# Patient Record
Sex: Female | Born: 1995
Health system: Southern US, Community
[De-identification: ages and names within clinical notes are randomized; demographics above are authoritative.]

## PROBLEM LIST (undated history)

## (undated) DIAGNOSIS — F429 Obsessive-compulsive disorder, unspecified: Secondary | ICD-10-CM

## (undated) DIAGNOSIS — F320 Major depressive disorder, single episode, mild: Secondary | ICD-10-CM

---
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
## 2016-01-14 ENCOUNTER — Ambulatory Visit (INDEPENDENT_AMBULATORY_CARE_PROVIDER_SITE_OTHER): Payer: BLUE CROSS/BLUE SHIELD | Admitting: Family Medicine

## 2016-01-14 ENCOUNTER — Encounter: Payer: Self-pay | Admitting: Family Medicine

## 2016-01-14 VITALS — BP 108/54 | HR 85 | Ht 64.0 in | Wt 115.0 lb

## 2016-01-14 DIAGNOSIS — F429 Obsessive-compulsive disorder, unspecified: Secondary | ICD-10-CM | POA: Diagnosis not present

## 2016-01-14 DIAGNOSIS — Z0000 Encounter for general adult medical examination without abnormal findings: Secondary | ICD-10-CM

## 2016-01-14 NOTE — Progress Notes (Signed)
   Subjective:    Patient ID: Katherine Michael, female    DOB: 12/13/1995, 20 y.o.   MRN: [id number]  HPI 20 year old female comes in today to establish care.She would like a physical. 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 She has some college and is currently in school to become an Charity fundraiserN. She is dating and is currently sexually active. She currently lives with her mother father and brother. She reports that she is very healthy. She was diagnosed with OCD when she was [age] and has been on Zoloft ever since then. Her doses have varied over the years and on how much stress or anxiety she has been experiencing. She does not actively see a psychiatrist or therapist at this point in time. Though she has in the past. Her flu shot is up-to-date.   Review of Systems  Constitutional: Negative for diaphoresis, fever and unexpected weight change.  HENT: Negative for hearing loss, rhinorrhea and tinnitus.   Eyes: Negative for visual disturbance.  Respiratory: Negative for cough and wheezing.   Cardiovascular: Negative for chest pain and palpitations.  Gastrointestinal: Negative for blood in stool, diarrhea, nausea and vomiting.  Genitourinary: Negative for difficulty urinating, vaginal bleeding and vaginal discharge.  Musculoskeletal: Negative for arthralgias and myalgias.  Skin: Negative for rash.  Neurological: Negative for headaches.  Hematological: Negative for adenopathy. Does not bruise/bleed easily.  Psychiatric/Behavioral: Negative for dysphoric mood and sleep disturbance. The patient is nervous/anxious.        Objective:   Physical Exam  Constitutional: She is oriented to person, place, and time. She appears well-developed and well-nourished.  HENT:  Head: Normocephalic and atraumatic.  Right Ear: External ear normal.  Left Ear: External ear normal.  Nose: Nose normal.  Mouth/Throat: Oropharynx is clear and moist.  TMs and canals are clear.    Eyes: Conjunctivae and EOM are normal. Pupils are equal, round, and reactive to light.  Neck: Neck supple. No thyromegaly present.  Cardiovascular: Normal rate, regular rhythm and normal heart sounds.   Pulmonary/Chest: Effort normal and breath sounds normal. She has no wheezes.  Abdominal: Soft. Bowel sounds are normal. She exhibits no distension and no mass. There is no tenderness. There is no rebound and no guarding.  Musculoskeletal: She exhibits no edema.  Lymphadenopathy:    She has no cervical adenopathy.  Neurological: She is alert and oriented to person, place, and time. She has normal reflexes.  Skin: Skin is warm and dry.  Psychiatric: She has a normal mood and affect. Her behavior is normal. Thought content normal.       Assessment & Plan:  CPE Keep up a regular exercise program and make sure you are eating a healthy diet Try to eat 4 servings of dairy a day, or if you are lactose intolerant take a calcium with vitamin D daily.  Your vaccines are up to date.   OCD-right now she is doing well currently on Zoloft 150 mg. Follow-up in 6 months. Okay to refill medication until then.

## 2016-01-14 NOTE — Patient Instructions (Signed)
Keep up a regular exercise program and make sure you are eating a healthy diet Try to eat 4 servings of dairy a day, or if you are lactose intolerant take a calcium with vitamin D daily.  Your vaccines are up to date.   

## 2016-05-28 ENCOUNTER — Other Ambulatory Visit: Payer: Self-pay | Admitting: *Deleted

## 2016-05-28 MED ORDER — SERTRALINE HCL 100 MG PO TABS: 150.0000 mg | ORAL_TABLET | Freq: Every day | ORAL | 0 refills | Status: DC

## 2016-07-10 ENCOUNTER — Ambulatory Visit (INDEPENDENT_AMBULATORY_CARE_PROVIDER_SITE_OTHER): Payer: BLUE CROSS/BLUE SHIELD | Admitting: Family Medicine

## 2016-07-10 ENCOUNTER — Encounter: Payer: Self-pay | Admitting: Family Medicine

## 2016-07-10 VITALS — BP 105/64 | HR 79 | Ht 64.0 in | Wt 114.0 lb

## 2016-07-10 DIAGNOSIS — F429 Obsessive-compulsive disorder, unspecified: Secondary | ICD-10-CM

## 2016-07-10 MED ORDER — SERTRALINE HCL 100 MG PO TABS: ORAL_TABLET | ORAL | 1 refills | Status: DC

## 2016-07-10 NOTE — Progress Notes (Signed)
   Subjective:    Patient ID: Katherine Michael, female    DOB: 01/06/1996, 21 y.o.   MRN: [id number]  HPI 21 year old female comes in today to follow-up for a CD. She is currently on sertraline. She is producing taking 150 mg about 2 months ago when ahead and went up to 200 mg. She feels like that and working well and has been effective and has not had any problems concerns or side effects. He is definitely had some increased stress levels because of school in fact her levels were quite high. She was having persistent diarrhea because of it was having to hydrate more. That's part of why she went up on her dosing but has been doing much better on 2 tabs. She denies feeling down or depressed.   Review of Systems     Objective:   Physical Exam  Constitutional: She is oriented to person, place, and time. She appears well-developed and well-nourished.  HENT:  Head: Normocephalic and atraumatic.  Cardiovascular: Normal rate, regular rhythm and normal heart sounds.   Pulmonary/Chest: Effort normal and breath sounds normal.  Neurological: She is alert and oriented to person, place, and time.  Skin: Skin is warm and dry.  Psychiatric: She has a normal mood and affect. Her behavior is normal.       Assessment & Plan:   OCD - Overall she is really doing well. She is at the max dose on sertraline. We'll keep her on this for about 6 months and she's doing well that time we may try to bump her back down to 150 mg. 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 PHQ 9 score of 2 and GAD 7 score of 13. Continue current regimen

## 2016-08-26 ENCOUNTER — Ambulatory Visit (INDEPENDENT_AMBULATORY_CARE_PROVIDER_SITE_OTHER): Payer: BLUE CROSS/BLUE SHIELD | Admitting: Family Medicine

## 2016-08-26 VITALS — BP 102/65 | HR 63

## 2016-08-26 DIAGNOSIS — Z111 Encounter for screening for respiratory tuberculosis: Secondary | ICD-10-CM | POA: Diagnosis not present

## 2016-08-26 NOTE — Progress Notes (Signed)
Agree with below. \Masiyah Jorstad, MD  

## 2016-08-26 NOTE — Progress Notes (Signed)
Patient came into clinic today for PPD test placement. 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 There is no work form to be completed, she will just need a printed copy when she comes in for PPD read. Pt has had PPD testing in the past, never tested positive. Pt tolerated placement in left forearm well, no immediate complications. Appointment scheduled for PPD read on Thursday, no further questions/concerns.

## 2016-08-28 ENCOUNTER — Ambulatory Visit (INDEPENDENT_AMBULATORY_CARE_PROVIDER_SITE_OTHER): Payer: BLUE CROSS/BLUE SHIELD | Admitting: Family Medicine

## 2016-08-28 VITALS — BP 101/53 | HR 75 | Wt 115.0 lb

## 2016-08-28 DIAGNOSIS — Z111 Encounter for screening for respiratory tuberculosis: Secondary | ICD-10-CM

## 2016-08-28 LAB — TB SKIN TEST
INDURATION: 0 mm
TB SKIN TEST: NEGATIVE

## 2016-08-28 NOTE — Progress Notes (Signed)
Pt came into clinic today for PPD read. Test was negative. Printed copy given to Pt. No further questions/concerns.

## 2016-08-28 NOTE — Progress Notes (Signed)
Noted  

## 2016-12-30 ENCOUNTER — Ambulatory Visit (INDEPENDENT_AMBULATORY_CARE_PROVIDER_SITE_OTHER): Payer: Managed Care, Other (non HMO) | Admitting: Physician Assistant

## 2016-12-30 ENCOUNTER — Encounter: Payer: Self-pay | Admitting: Physician Assistant

## 2016-12-30 VITALS — BP 115/63 | HR 80 | Temp 98.5°F | Ht 64.0 in | Wt 121.0 lb

## 2016-12-30 DIAGNOSIS — Z23 Encounter for immunization: Secondary | ICD-10-CM | POA: Diagnosis not present

## 2016-12-30 DIAGNOSIS — Z0000 Encounter for general adult medical examination without abnormal findings: Secondary | ICD-10-CM | POA: Diagnosis not present

## 2016-12-30 DIAGNOSIS — Z020 Encounter for examination for admission to educational institution: Secondary | ICD-10-CM

## 2016-12-30 LAB — POCT HEMOGLOBIN: HEMOGLOBIN: 13.4 g/dL (ref 12.2–16.2)

## 2016-12-30 NOTE — Progress Notes (Signed)
Subjective:    Patient ID: Katherine Michael, female    DOB: [birth year], 21 y.o.   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 She has no problems or concerns today.   OCD- controlled on zoloft for 9 years.   .. Active Ambulatory Problems    Diagnosis Date Noted  . OCD (obsessive compulsive disorder) 04/15/2007   Resolved Ambulatory Problems    Diagnosis Date Noted  . No Resolved Ambulatory Problems   No Additional Past Medical History    .Marland Kitchen Family History  Problem Relation Age of Onset  . Breast cancer Maternal Grandmother   . Colon cancer Maternal Grandfather   . Breast cancer Paternal Grandmother   . Parkinson's disease Paternal Grandfather    .Marland Kitchen Social History   Social History  . Marital status: Single    Spouse name: N/A  . Number of children: N/A  . Years of education: some college   Occupational History  . cna [employer]   Social History Main Topics  . Smoking status: Never Smoker  . Smokeless tobacco: Never Used  . Alcohol use No  . Drug use: No  . Sexual activity: Yes    Partners: Male   Other Topics Concern  . Not on file   Social History Narrative  . No narrative on file      Review of Systems  All other systems reviewed and are negative.      Objective:   Physical Exam  Constitutional: She is oriented to person, place, and time. She appears well-developed and well-nourished.  HENT:  Head: Normocephalic and atraumatic.  Eyes: Conjunctivae are normal. Right eye exhibits no discharge. Left eye exhibits no discharge.  Neck: Normal range of motion. Neck supple. No thyromegaly present.  Cardiovascular: Normal rate, regular rhythm and normal heart sounds.   Pulmonary/Chest: Effort normal and breath sounds normal. She has no wheezes.  Abdominal: Soft. Bowel sounds are normal. She exhibits no distension and no mass. There is no tenderness.  There is no rebound and no guarding.  Musculoskeletal: Normal range of motion.  Lymphadenopathy:    She has no cervical adenopathy.  Neurological: She is alert and oriented to person, place, and time.  Psychiatric: She has a normal mood and affect. Her behavior is normal.          Assessment & Plan:  Marland KitchenMarland KitchenDiagnoses and all orders for this visit:  Encounter for school health examination -     Tdap vaccine greater than or equal to 7yo IM -     Flu Vaccine QUAD 6+ mos PF IM (Fluarix Quad PF) -     PPD -     POCT hemoglobin  .Marland Kitchen Depression screen [facility] 2/9 12/30/2016 07/10/2016 01/14/2016  Decreased Interest 0 0 0  Down, Depressed, Hopeless 0 1 0  PHQ - 2 Score 0 1 0  Altered sleeping - 0 1  Tired, decreased energy - 1 0  Change in appetite - 0 0  Feeling bad or failure about yourself  - 0 1  Trouble concentrating - 0 0  Moving slowly or fidgety/restless - 0 0  Suicidal thoughts - 0 0  PHQ-9 Score - 2 2    Filled out paperwork.  Vaccines reviewed.  OCD controlled and been on medication for 9 plus years.  Come back in 48 hours to have PPD read.

## 2017-01-01 ENCOUNTER — Ambulatory Visit (INDEPENDENT_AMBULATORY_CARE_PROVIDER_SITE_OTHER): Payer: Managed Care, Other (non HMO) | Admitting: Family Medicine

## 2017-01-01 VITALS — BP 106/57 | HR 85 | Wt 120.0 lb

## 2017-01-01 DIAGNOSIS — Z111 Encounter for screening for respiratory tuberculosis: Secondary | ICD-10-CM

## 2017-01-01 NOTE — Progress Notes (Signed)
Agree with above.  Laurell Coalson, MD  

## 2017-01-01 NOTE — Progress Notes (Signed)
Pt is here for PPD read.  Results are negative, 0 mm

## 2017-10-09 ENCOUNTER — Telehealth: Payer: Self-pay | Admitting: Family Medicine

## 2017-10-09 MED ORDER — SERTRALINE HCL 100 MG PO TABS: 100.0000 mg | ORAL_TABLET | Freq: Every day | ORAL | 0 refills | Status: DC

## 2017-10-09 NOTE — Telephone Encounter (Signed)
Pt scheduled appt at first available option in July, but will run out before then. Pt states she only has been taking 100mg , she did not have to increase to the two tab directions so her last Rx lasted longer. Asking for short term supply to be sent. Rx pended.

## 2017-10-09 NOTE — Telephone Encounter (Signed)
Pt advised.

## 2017-10-09 NOTE — Telephone Encounter (Signed)
rx sent for 30 days

## 2017-10-26 ENCOUNTER — Encounter: Payer: Self-pay | Admitting: Family Medicine

## 2017-10-26 ENCOUNTER — Ambulatory Visit (INDEPENDENT_AMBULATORY_CARE_PROVIDER_SITE_OTHER): Payer: Managed Care, Other (non HMO) | Admitting: Family Medicine

## 2017-10-26 VITALS — BP 108/62 | HR 85 | Ht 64.0 in | Wt 118.0 lb

## 2017-10-26 DIAGNOSIS — Z3009 Encounter for other general counseling and advice on contraception: Secondary | ICD-10-CM | POA: Diagnosis not present

## 2017-10-26 DIAGNOSIS — F429 Obsessive-compulsive disorder, unspecified: Secondary | ICD-10-CM

## 2017-10-26 DIAGNOSIS — R400 Somnolence: Secondary | ICD-10-CM | POA: Diagnosis not present

## 2017-10-26 MED ORDER — SERTRALINE HCL 100 MG PO TABS: 200.0000 mg | ORAL_TABLET | Freq: Every day | ORAL | 1 refills | Status: DC

## 2017-10-26 NOTE — Progress Notes (Signed)
Subjective:    Patient ID: Katherine Michael, female    DOB: 12/14/1995, 22 y.o.   MRN: [id number]  HPI  22 year old female is here today to follow-up for obsessive-compulsive disorder.  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  She is doing well on her Zoloft.  Currently taking 200 mg daily.  She has a couple more months left and she will finish up her bachelor's degree as well.  She is been enjoying working in the emergency department.  He does complain of feeling like she can very easily fall asleep during the daytime.  She says her father has very similar symptoms.  She denies any excess snoring.  She remembers it starting in high school.  She would fall asleep during classes.  And even now she will notice even while standing sometimes she will almost want to not off during the daytime.  She actually feels like she sleeps really well overall most nights.  She says even after great night sleep she will get up run a couple [place] come back home to try to study and then easily fall asleep.  She also had a question about her birth control.  She does follow with GYN and her yearly appointment scheduled in September.  She went to know if she would be able to take her birth control through her placebo week of pills that she is actually getting married in October and she is scheduled to have her menstrual cycle that week.  Review of Systems  BP 108/62   Pulse 85   Ht 5\' 4"  (1.626 m)   Wt 118 lb (53.5 kg)   SpO2 99%   BMI 20.25 kg/m     No Known Allergies  History reviewed. No pertinent past medical history.  History reviewed. No pertinent surgical history.  Social History   Socioeconomic History  . Marital status: Single    Spouse name: Not on file  . Number of children: Not on file  . Years of education: some college  . Highest education level: Not on file  Occupational History  . Occupation: cna    Employer: [employer]  Social Needs  . Financial resource strain: Not on  file  . Food insecurity:    Worry: Not on file    Inability: Not on file  . Transportation needs:    Medical: Not on file    Non-medical: Not on file  Tobacco Use  . Smoking status: Never Smoker  . Smokeless tobacco: Never Used  Substance and Sexual Activity  . Alcohol use: No  . Drug use: No  . Sexual activity: Yes    Partners: Male  Lifestyle  . Physical activity:    Days per week: Not on file    Minutes per session: Not on file  . Stress: Not on file  Relationships  . Social connections:    Talks on phone: Not on file    Gets together: Not on file    Attends religious service: Not on file    Active member of club or organization: Not on file    Attends meetings of clubs or organizations: Not on file    Relationship status: Not on file  . Intimate partner violence:    Fear of current or ex partner: Not on file    Emotionally abused: Not on file    Physically abused: Not on file    Forced sexual activity: Not on file  Other Topics Concern  . Not on file  Social History Narrative  . Not on file    Family History  Problem Relation Age of Onset  . Breast cancer Maternal Grandmother   . Colon cancer Maternal Grandfather   . Breast cancer Paternal Grandmother   . Parkinson's disease Paternal Grandfather     Outpatient Encounter Medications as of 10/26/2017  Medication Sig  . JUNEL FE 1/20 1-20 MG-MCG tablet   . sertraline (ZOLOFT) 100 MG tablet Take 2 tablets (200 mg total) by mouth daily.  . [DISCONTINUED] sertraline (ZOLOFT) 100 MG tablet Take 1 tablet (100 mg total) by mouth daily. Take 2 tablets by mouth daily   No facility-administered encounter medications on file as of 10/26/2017.          Objective:   Physical Exam  Constitutional: She is oriented to person, place, and time. She appears well-developed and well-nourished.  HENT:  Head: Normocephalic and atraumatic.  Cardiovascular: Normal rate, regular rhythm and normal heart sounds.  Pulmonary/Chest:  Effort normal and breath sounds normal.  Neurological: She is alert and oriented to person, place, and time.  Skin: Skin is warm and dry.  Psychiatric: She has a normal mood and affect. Her behavior is normal.        Assessment & Plan:  OCD -continue with current regimen.  Her clinic couple months when she completes her bachelor's degree we can work on decreasing her dose may be on 250 mg for a month and then may be even down to 100 mg.  Otherwise I will see her back in 6 months.  PHQ 9 score of 2 and gad 7 score of 6.  Excess daytime somnolence-discussed options.  We will check for anemia and thyroid disorder.  Also consider that it could be secondary to her high dose on her Zoloft.  She will actually finish up her bachelor's degree in a couple months and at that point plans on coming down on the dose on her medication we can certainly see if her symptoms improve at that point.  If not consider referral to sleep specialist for further work-up.  Contraceptive counseling-okay to skip the placebo week of the birth control pill and start a new pill pack  Declines Gardasil vaccine.  Get records from her GYN for Pap smear and Chlamydia testing.

## 2017-10-27 LAB — IRON, TOTAL/TOTAL IRON BINDING CAP
%SAT: 27 % (calc) (ref 16–45)
Iron: 141 ug/dL (ref 40–190)
TIBC: 515 mcg/dL (calc) — ABNORMAL HIGH (ref 250–450)

## 2017-10-27 LAB — BASIC METABOLIC PANEL WITH GFR
BUN: 10 mg/dL (ref 7–25)
CALCIUM: 10 mg/dL (ref 8.6–10.2)
CHLORIDE: 104 mmol/L (ref 98–110)
CO2: 26 mmol/L (ref 20–32)
Creat: 0.73 mg/dL (ref 0.50–1.10)
GFR, EST NON AFRICAN AMERICAN: 117 mL/min/{1.73_m2} (ref >=60)
GFR, Est African American: 135 mL/min/{1.73_m2} (ref >=60)
GLUCOSE: 104 mg/dL — AB (ref 65–99)
POTASSIUM: 3.9 mmol/L (ref 3.5–5.3)
Sodium: 140 mmol/L (ref 135–146)

## 2017-10-27 LAB — B12 AND FOLATE PANEL
Folate: 20.2 ng/mL
VITAMIN B 12: 341 pg/mL (ref 200–1100)

## 2017-10-27 LAB — CBC
HEMATOCRIT: 42.4 % (ref 35.0–45.0)
Hemoglobin: 14.4 g/dL (ref 11.7–15.5)
MCH: 30.6 pg (ref 27.0–33.0)
MCHC: 34 g/dL (ref 32.0–36.0)
MCV: 90.2 fL (ref 80.0–100.0)
MPV: 9.3 fL (ref 7.5–12.5)
PLATELETS: 295 10*3/uL (ref 140–400)
RBC: 4.7 10*6/uL (ref 3.80–5.10)
RDW: 12.1 % (ref 11.0–15.0)
WBC: 6 10*3/uL (ref 3.8–10.8)

## 2017-10-27 LAB — TSH: TSH: 0.84 mIU/L

## 2017-10-27 LAB — VITAMIN D 25 HYDROXY (VIT D DEFICIENCY, FRACTURES): VIT D 25 HYDROXY: 61 ng/mL (ref 30–100)

## 2018-01-18 ENCOUNTER — Ambulatory Visit (INDEPENDENT_AMBULATORY_CARE_PROVIDER_SITE_OTHER): Payer: Managed Care, Other (non HMO) | Admitting: Family Medicine

## 2018-01-18 ENCOUNTER — Encounter: Payer: Self-pay | Admitting: Family Medicine

## 2018-01-18 VITALS — BP 111/71 | HR 85 | Ht 64.0 in | Wt 119.0 lb

## 2018-01-18 DIAGNOSIS — T50905A Adverse effect of unspecified drugs, medicaments and biological substances, initial encounter: Secondary | ICD-10-CM

## 2018-01-18 DIAGNOSIS — Z882 Allergy status to sulfonamides status: Secondary | ICD-10-CM

## 2018-01-18 DIAGNOSIS — L270 Generalized skin eruption due to drugs and medicaments taken internally: Secondary | ICD-10-CM | POA: Diagnosis not present

## 2018-01-18 MED ORDER — PREDNISONE 20 MG PO TABS: 40.0000 mg | ORAL_TABLET | Freq: Every day | ORAL | 0 refills | Status: DC

## 2018-01-18 MED ORDER — METHYLPREDNISOLONE SODIUM SUCC 125 MG IJ SOLR
125.0000 mg | Freq: Once | INTRAMUSCULAR | Status: AC
  Administered 2018-01-18: 125 mg via INTRAMUSCULAR

## 2018-01-18 NOTE — Progress Notes (Signed)
Subjective:    Patient ID: Katherine Michael, female    DOB: 08-25-1995, 22 y.o.   MRN: [id number]  HPI  pt reports that she was tx for UTI at [facility] and was started off with Macrobid 100 mg BID x 7 days on 01/04/18. Then she was called and told to switch to Bactrim based on the culture.  Started the Bactrim on 01/08/18.  Rash started Sat  Night and taking Benadryl.  Friday night she had a ceasar salad and a cocktail.  She has been taking the bactrim for 7 days.  No SOB or wheezing.  No trouble swallowing.  She is getting married on Friday.    Review of Systems BP 111/71   Pulse 85   Ht 5\' 4"  (1.626 m)   Wt 119 lb (54 kg)   SpO2 99%   BMI 20.43 kg/m     Allergies  Allergen Reactions  . Bactrim [Sulfamethoxazole-Trimethoprim] Rash    No past medical history on file.  No past surgical history on file.  Social History   Socioeconomic History  . Marital status: Single    Spouse name: Not on file  . Number of children: Not on file  . Years of education: some college  . Highest education level: Not on file  Occupational History  . Occupation: cna    Employer: [employer]  Social Needs  . Financial resource strain: Not on file  . Food insecurity:    Worry: Not on file    Inability: Not on file  . Transportation needs:    Medical: Not on file    Non-medical: Not on file  Tobacco Use  . Smoking status: Never Smoker  . Smokeless tobacco: Never Used  Substance and Sexual Activity  . Alcohol use: No  . Drug use: No  . Sexual activity: Yes    Partners: Male  Lifestyle  . Physical activity:    Days per week: Not on file    Minutes per session: Not on file  . Stress: Not on file  Relationships  . Social connections:    Talks on phone: Not on file    Gets together: Not on file    Attends religious service: Not on file    Active member of club or organization: Not on file    Attends meetings of clubs or organizations: Not on file    Relationship status: Not on file  .  Intimate partner violence:    Fear of current or ex partner: Not on file    Emotionally abused: Not on file    Physically abused: Not on file    Forced sexual activity: Not on file  Other Topics Concern  . Not on file  Social History Narrative  . Not on file    Family History  Problem Relation Age of Onset  . Breast cancer Maternal Grandmother   . Colon cancer Maternal Grandfather   . Breast cancer Paternal Grandmother   . Parkinson's disease Paternal Grandfather     Outpatient Encounter Medications as of 01/18/2018  Medication Sig  . JUNEL FE 1/20 1-20 MG-MCG tablet   . sertraline (ZOLOFT) 100 MG tablet Take 2 tablets (200 mg total) by mouth daily.  . predniSONE (DELTASONE) 20 MG tablet Take 2 tablets (40 mg total) by mouth daily with breakfast.  . [DISCONTINUED] predniSONE (DELTASONE) 20 MG tablet Take 2 tablets (40 mg total) by mouth daily with breakfast.  . [EXPIRED] methylPREDNISolone sodium succinate (SOLU-MEDROL) 125 mg/2 mL injection  125 mg    No facility-administered encounter medications on file as of 01/18/2018.           Objective:   Physical Exam  Constitutional: She is oriented to person, place, and time. She appears well-developed and well-nourished.  HENT:  Head: Normocephalic and atraumatic.  Right Ear: External ear normal.  Left Ear: External ear normal.  Nose: Nose normal.  Mouth/Throat: Oropharynx is clear and moist.  TMs and canals are clear.   Eyes: Pupils are equal, round, and reactive to light. Conjunctivae and EOM are normal.  Neck: Neck supple. No thyromegaly present.  Cardiovascular: Normal rate, regular rhythm and normal heart sounds.  Pulmonary/Chest: Effort normal and breath sounds normal. She has no wheezes.  Lymphadenopathy:    She has no cervical adenopathy.  Neurological: She is alert and oriented to person, place, and time.  Skin: Skin is warm and dry.  Psychiatric: She has a normal mood and affect.        Assessment & Plan:   Drug rash -this should go away with time but she is actually supposed to be getting married on Friday so to try to reduce the itching and erythema is much as possible and that I put her on oral prednisone.  Given Solu-Medrol 125.  Continue with antihistamine as needed.  Medication side effect: continue current regimen.

## 2018-07-26 ENCOUNTER — Other Ambulatory Visit: Payer: Self-pay | Admitting: Family Medicine

## 2018-08-26 ENCOUNTER — Other Ambulatory Visit: Payer: Self-pay | Admitting: Family Medicine

## 2018-08-26 MED ORDER — SERTRALINE HCL 100 MG PO TABS: 200.0000 mg | ORAL_TABLET | Freq: Every day | ORAL | 0 refills | Status: DC

## 2018-08-26 NOTE — Telephone Encounter (Signed)
Pt called for refill on zoloft. Advised she was due for follow up and we tried to schedule for virtual today- but it got taken before I could get her scheduled. She is scheduled for tomorrow PM. Pt questions if refill can be sent today since she is scheduled for tomorrow. Will route to PCP.

## 2018-08-26 NOTE — Telephone Encounter (Signed)
Attempted to contact Pt to advise - no answer and no VM

## 2018-08-27 ENCOUNTER — Ambulatory Visit: Payer: Managed Care, Other (non HMO) | Admitting: Family Medicine

## 2018-08-27 ENCOUNTER — Ambulatory Visit (INDEPENDENT_AMBULATORY_CARE_PROVIDER_SITE_OTHER): Payer: Managed Care, Other (non HMO) | Admitting: Family Medicine

## 2018-08-27 ENCOUNTER — Encounter: Payer: Self-pay | Admitting: Family Medicine

## 2018-08-27 VITALS — HR 92 | Temp 98.3°F | Ht 64.0 in | Wt 118.0 lb

## 2018-08-27 DIAGNOSIS — F429 Obsessive-compulsive disorder, unspecified: Secondary | ICD-10-CM

## 2018-08-27 MED ORDER — SERTRALINE HCL 100 MG PO TABS: 100.0000 mg | ORAL_TABLET | Freq: Every day | ORAL | 3 refills | Status: DC

## 2018-08-27 NOTE — Progress Notes (Signed)
Virtual Visit via Video Note  I connected with Katherine Michael on 08/27/18 at  3:20 PM EDT by a video enabled telemedicine application and verified that I am speaking with the correct person using two identifiers.   I discussed the limitations of evaluation and management by telemedicine and the availability of in person appointments. The patient expressed understanding and agreed to proceed.  Pt was at home and I was in my office for the virtual visit.     Subjective:    CC: Mood.   HPI: Last seen in July for mood. She has OCD and reports that overall she is actually doing really well.  She says she really had tried backing down to just 100 mg of Zoloft daily and so far has been doing well on that.  She felt like when she was taking higher doses it would make her sleepy.  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  She said for the first time in her life she had what she thought was a panic attack.  She woke up feeling short of breath and then having chest pain.  She says the whole episode lasted about 10 or 15 minutes she was able to talk herself down and her husband was able to reassure her as well.  She says is happened one more time since then.  She really feels like it stress related.  She says in the past when she is become very stressed she has had chest discomfort with it.  She says it was all over her chest when it occurred.  It was not associated with any other significant symptoms except for some brief 5-minute shortness of breath.  She feels like she is actually sleeping really well.  He is on birth control and follows with [facility].   Past medical history, Surgical history, Family history not pertinant except as noted below, Social history, Allergies, and medications have been entered into the medical record, reviewed, and corrections made.   Review of Systems: No fevers, chills, night  sweats, weight loss, chest pain, or shortness of breath.   Objective:    General: Speaking clearly in complete sentences without any shortness of breath.  Alert and oriented x3.  Normal judgment. No apparent acute distress. Well groomed.    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  We did discuss the option of therapy or counseling if at any point she felt like it would be helpful.  For now we will continue with 100 mg of Zoloft daily since higher doses seem to be a little too sedating.  Refill sent for 1 year she can follow-up in 1 year.     I discussed the assessment and treatment plan with the patient. The patient was provided an opportunity to ask questions and all were answered. The patient agreed with the plan and demonstrated an understanding of the instructions.   The patient was advised to call back or seek an in-person evaluation if the symptoms worsen or if the condition fails to improve as anticipated.   Nani Gasser, MD

## 2018-10-20 ENCOUNTER — Encounter: Payer: Self-pay | Admitting: Family Medicine

## 2018-10-20 ENCOUNTER — Ambulatory Visit (INDEPENDENT_AMBULATORY_CARE_PROVIDER_SITE_OTHER): Payer: Managed Care, Other (non HMO) | Admitting: Family Medicine

## 2018-10-20 VITALS — HR 78 | Temp 98.1°F

## 2018-10-20 DIAGNOSIS — F429 Obsessive-compulsive disorder, unspecified: Secondary | ICD-10-CM | POA: Diagnosis not present

## 2018-10-20 DIAGNOSIS — F320 Major depressive disorder, single episode, mild: Secondary | ICD-10-CM | POA: Diagnosis not present

## 2018-10-20 MED ORDER — BUPROPION HCL ER (XL) 150 MG PO TB24: 150.0000 mg | ORAL_TABLET | ORAL | 1 refills | Status: DC

## 2018-10-20 NOTE — Progress Notes (Signed)
Virtual Visit via Video Note  I connected with Katherine Michael on 10/20/18 at 11:10 AM EDT by a video enabled telemedicine application and verified that I am speaking with the correct person using two identifiers.   I discussed the limitations of evaluation and management by telemedicine and the availability of in person appointments. The patient expressed understanding and agreed to proceed.  Pt was at home and I was in my office for the virtual visit.     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  I had last seen her via virtual visit in May and at the time she was doing okay she was still having some increase stressors.  She is actually been on Zoloft for years and feels like it does a really good job in controlling her OCD, but more recently she is just felt more down.  She says she feels like when she is off of work she should be able to relax and just enjoy herself and be happy and says she just rarely feels that way.  She feels like if anything it should be worse at work but she actually feels like she does better at work because are just a lot going on is very distracting.  She has no thoughts and wanting to harm herself.  Sometimes her anxiety really kicks in as well especially if she does not have a lot of plans for the day it makes her feel anxious.  She has done therapy/counseling in the past years ago when she first started dealing with her OCD symptoms which right now she feels like her really well controlled.  He does have good family support.  Says she really does not have any major stressors going on right now that would make her feel this way and so that is why she does not understand why she feels this way.  Past medical history, Surgical history, Family history not pertinant except as noted below, Social history, Allergies, and medications have been entered into the medical record, reviewed, and  corrections made.   Review of Systems: No fevers, chills, night sweats, weight loss, chest pain, or shortness of breath.   Objective:    General: Speaking clearly in complete sentences without any shortness of breath.  Alert and oriented x3.  Normal judgment. No apparent acute distress.    Impression and Recommendations:   OCD-currently well controlled on sertraline.   New onset mild depressive symptoms-we discussed treatment options including therapy/counseling.  I still want her to really consider this setting could be helpful in the long run for her.  I think she feels like because there is nothing specific going on her life it is triggering the symptoms that it may not be as helpful.  We discussed medication adjustment including a possible trial of Wellbutrin for short period of time maybe 30 days just to see if it is helpful and or discontinuing her Zoloft and trying a completely different medication.  She does not really remember trying anything else in the past and has been on the Zoloft for years.  She has tried a higher strength of Zoloft in the past but it was too sedating.  After much discussion she would like to try the Wellbutrin first before discontinuing the Zoloft which I think is reasonable.  Plan to follow-up in about 3 to 4 weeks.  Okay to do a virtual visit.  [facility]  10/20/2018 08/27/2018 10/26/2017 12/30/2016 07/10/2016  Decreased Interest 1 0 0 0 0  Down, Depressed, Hopeless 1 0 0 0 1  PHQ - 2 Score 2 0 0 0 1  Altered sleeping 1 - 1 - 0  Tired, decreased energy 1 - 1 - 1  Change in appetite 0 - 0 - 0  Feeling bad or failure about yourself  2 - 0 - 0  Trouble concentrating 0 - 0 - 0  Moving slowly or fidgety/restless 0 - 0 - 0  Suicidal thoughts 0 - 0 - 0  PHQ-9 Score 6 - 2 - 2  Difficult doing work/chores Not difficult at all - Somewhat difficult - -     I discussed the assessment and treatment plan with the patient. The patient was provided an  opportunity to ask questions and all were answered. The patient agreed with the plan and demonstrated an understanding of the instructions.   The patient was advised to call back or seek an in-person evaluation if the symptoms worsen or if the condition fails to improve as anticipated.   Beatrice Lecher, MD

## 2018-10-20 NOTE — Progress Notes (Signed)
Pt feels that she has been on it so long and it is not doing what it needs to. And she doesn't enjoy life as she once did.Marland KitchenMarland KitchenMarland KitchenElouise Munroe, Spry

## 2018-11-15 ENCOUNTER — Encounter: Payer: Self-pay | Admitting: Family Medicine

## 2018-11-15 MED ORDER — BUPROPION HCL ER (XL) 150 MG PO TB24: 150.0000 mg | ORAL_TABLET | ORAL | 0 refills | Status: DC

## 2018-12-02 ENCOUNTER — Telehealth (INDEPENDENT_AMBULATORY_CARE_PROVIDER_SITE_OTHER): Payer: Managed Care, Other (non HMO) | Admitting: Family Medicine

## 2018-12-02 ENCOUNTER — Encounter: Payer: Self-pay | Admitting: Family Medicine

## 2018-12-02 VITALS — HR 83 | Temp 98.6°F | Ht 64.0 in

## 2018-12-02 DIAGNOSIS — F320 Major depressive disorder, single episode, mild: Secondary | ICD-10-CM

## 2018-12-02 HISTORY — DX: Major depressive disorder, single episode, mild: F32.0

## 2018-12-02 MED ORDER — SERTRALINE HCL 100 MG PO TABS: 100.0000 mg | ORAL_TABLET | Freq: Every day | ORAL | 3 refills | Status: DC

## 2018-12-02 MED ORDER — SERTRALINE HCL 25 MG PO TABS: 25.0000 mg | ORAL_TABLET | Freq: Every day | ORAL | 1 refills | Status: DC

## 2018-12-02 MED ORDER — BUPROPION HCL ER (XL) 150 MG PO TB24: 150.0000 mg | ORAL_TABLET | ORAL | 1 refills | Status: DC

## 2018-12-02 NOTE — Progress Notes (Signed)
Pt doing well on current regimen.Marland KitchenMarland KitchenElouise Michael, [name]

## 2018-12-02 NOTE — Assessment & Plan Note (Signed)
Overall happy with the addition of Wellbutrin.  She really feels like it helps significantly with the low mood episodes.  Still struggling a little bit with the anxiety so we discussed options including discontinuing current regimen and continuing to follow it.  We also discussed possibility of therapy and counseling.  We also discussed increasing her sertraline.  She is okay with going up by small dose.  She had tried 150 in the past and felt a little too sedated on it.

## 2018-12-02 NOTE — Progress Notes (Signed)
Virtual Visit via Video Note  I connected with Katherine Michael on 12/02/18 at  8:50 AM EDT by a video enabled telemedicine application and verified that I am speaking with the correct person using two identifiers.   I discussed the limitations of evaluation and management by telemedicine and the availability of in person appointments. The patient expressed understanding and agreed to proceed.   Established Patient Office Visit  Subjective:  Patient ID: Katherine Michael, female    DOB: 09/03/1995  Age: 23 y.o. MRN: [id number]  CC:  Chief Complaint  Patient presents with  . 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 She was previously on Zoloft and we decided to add Zoloft.   Feels the like the wellbutrin has helped with the feeling down but still having some anxiety episodes.  Had one episodes of getting suddenly panicky on Monday while out shopping. Also had episode when woke up feeling anxious.  She has been on 150 of the Zoloft in the past and felt a little more drowsy.    No past medical history on file.  No past surgical history on file.  Family History  Problem Relation Age of Onset  . Breast cancer Maternal Grandmother   . Colon cancer Maternal Grandfather   . Breast cancer Paternal Grandmother   . Parkinson's disease Paternal Grandfather     Social History   Socioeconomic History  . Marital status: Single    Spouse name: Not on file  . Number of children: Not on file  . Years of education: some college  . Highest education level: Not on file  Occupational History  . Occupation: cna    Employer: [employer]  Social Needs  . Financial resource strain: Not on file  . Food insecurity    Worry: Not on file    Inability: Not on file  . Transportation needs    Medical: Not on file    Non-medical: Not on file  Tobacco Use  . Smoking status: Never Smoker  . Smokeless tobacco: Never Used   Substance and Sexual Activity  . Alcohol use: No  . Drug use: No  . Sexual activity: Yes    Partners: Male  Lifestyle  . Physical activity    Days per week: Not on file    Minutes per session: Not on file  . Stress: Not on file  Relationships  . Social Musicianconnections    Talks on phone: Not on file    Gets together: Not on file    Attends religious service: Not on file    Active member of club or organization: Not on file    Attends meetings of clubs or organizations: Not on file    Relationship status: Not on file  . Intimate partner violence    Fear of current or ex partner: Not on file    Emotionally abused: Not on file    Physically abused: Not on file    Forced sexual activity: Not on file  Other Topics Concern  . Not on file  Social History Narrative  . Not on file    Outpatient Medications Prior to Visit  Medication Sig Dispense Refill  . JUNEL FE 1/20 1-20 MG-MCG tablet   10  . buPROPion (WELLBUTRIN XL) 150 MG 24 hr tablet Take 1 tablet (150 mg total) by mouth every morning. 30 tablet 0  . sertraline (ZOLOFT) 100  MG tablet Take 1 tablet (100 mg total) by mouth daily. 90 tablet 3   No facility-administered medications prior to visit.     Allergies  Allergen Reactions  . Bactrim [Sulfamethoxazole-Trimethoprim] Rash    ROS Review of Systems    Objective:    Physical Exam  Pulse 83   Temp 98.6 F (37 C)   Ht 5\' 4"  (1.626 m)   SpO2 98%   BMI 20.25 kg/m  Wt Readings from Last 3 Encounters:  08/27/18 118 lb (53.5 kg)  01/18/18 119 lb (54 kg)  10/26/17 118 lb (53.5 kg)     There are no preventive care reminders to display for this patient.  There are no preventive care reminders to display for this patient.  Lab Results  Component Value Date   TSH 0.84 10/26/2017   Lab Results  Component Value Date   WBC 6.0 10/26/2017   HGB 14.4 10/26/2017   HCT 42.4 10/26/2017   MCV 90.2 10/26/2017   PLT 295 10/26/2017   Lab Results  Component Value  Date   NA 140 10/26/2017   K 3.9 10/26/2017   CO2 26 10/26/2017   GLUCOSE 104 (H) 10/26/2017   BUN 10 10/26/2017   CREATININE 0.73 10/26/2017   CALCIUM 10.0 10/26/2017   No results found for: CHOL No results found for: HDL No results found for: LDLCALC No results found for: TRIG No results found for: CHOLHDL No results found for: HGBA1C    Assessment & Plan:   Problem List Items Addressed This Visit      Other   Depression, major, single episode, mild ([place]) - Primary    Overall happy with the addition of Wellbutrin.  She really feels like it helps significantly with the low mood episodes.  Still struggling a little bit with the anxiety so we discussed options including discontinuing current regimen and continuing to follow it.  We also discussed possibility of therapy and counseling.  We also discussed increasing her sertraline.  She is okay with going up by small dose.  She had tried 150 in the past and felt a little too sedated on it.       Relevant Medications   sertraline (ZOLOFT) 25 MG tablet   sertraline (ZOLOFT) 100 MG tablet   buPROPion (WELLBUTRIN XL) 150 MG 24 hr tablet      Meds ordered this encounter  Medications  . DISCONTD: sertraline (ZOLOFT) 100 MG tablet    Sig: Take 1 tablet (100 mg total) by mouth daily.    Dispense:  90 tablet    Refill:  3  . sertraline (ZOLOFT) 25 MG tablet    Sig: Take 1 tablet (25 mg total) by mouth daily.    Dispense:  90 tablet    Refill:  1  . sertraline (ZOLOFT) 100 MG tablet    Sig: Take 1 tablet (100 mg total) by mouth daily.    Dispense:  90 tablet    Refill:  3  . buPROPion (WELLBUTRIN XL) 150 MG 24 hr tablet    Sig: Take 1 tablet (150 mg total) by mouth every morning.    Dispense:  90 tablet    Refill:  1    Follow-up: No follow-ups on file.    I discussed the assessment and treatment plan with the patient. The patient was provided an opportunity to ask questions and all were answered. The patient agreed with  the plan and demonstrated an understanding of the instructions.   The patient  was advised to call back or seek an in-person evaluation if the symptoms worsen or if the condition fails to improve as anticipated.   [name], MD

## 2018-12-10 ENCOUNTER — Other Ambulatory Visit: Payer: Self-pay

## 2018-12-10 ENCOUNTER — Encounter: Payer: Self-pay | Admitting: Physician Assistant

## 2018-12-10 ENCOUNTER — Telehealth: Payer: Self-pay

## 2018-12-10 ENCOUNTER — Ambulatory Visit (INDEPENDENT_AMBULATORY_CARE_PROVIDER_SITE_OTHER): Payer: Managed Care, Other (non HMO) | Admitting: Physician Assistant

## 2018-12-10 VITALS — BP 113/78 | HR 104 | Temp 98.9°F

## 2018-12-10 DIAGNOSIS — M549 Dorsalgia, unspecified: Secondary | ICD-10-CM

## 2018-12-10 DIAGNOSIS — R000 Tachycardia, unspecified: Secondary | ICD-10-CM | POA: Diagnosis not present

## 2018-12-10 DIAGNOSIS — R109 Unspecified abdominal pain: Secondary | ICD-10-CM | POA: Diagnosis not present

## 2018-12-10 LAB — POCT URINALYSIS DIPSTICK
Bilirubin, UA: NEGATIVE
Blood, UA: NEGATIVE
Glucose, UA: NEGATIVE
Ketones, UA: NEGATIVE
Leukocytes, UA: NEGATIVE
Nitrite, UA: NEGATIVE
Protein, UA: NEGATIVE
Spec Grav, UA: 1.01 (ref 1.010–1.025)
Urobilinogen, UA: 0.2 E.U./dL
pH, UA: 7 (ref 5.0–8.0)

## 2018-12-10 NOTE — Telephone Encounter (Signed)
Noted. Routing to PCP as FYI. I recommended follow-up on Monday

## 2018-12-10 NOTE — Telephone Encounter (Signed)
Patient called to let us know that she has decided to not do CT. Patient states she would rather just rest and stay hydrated and see if she feels better. FYI to provider.   I advised patient if any new or worsening of SX to seek care at closest ER or Urgent Care

## 2018-12-10 NOTE — Patient Instructions (Signed)
Flank Pain, Adult Flank pain is pain that is located on the side of the body between the upper abdomen and the back. This area is called the flank. The pain may occur over a short period of time (acute), or it may be long-term or recurring (chronic). It may be mild or severe. Flank pain can be caused by many things, including:  Muscle soreness or injury.  Kidney stones or kidney disease.  Stress.  A disease of the spine (vertebral disk disease).  A lung infection (pneumonia).  Fluid around the lungs (pulmonary edema).  A skin rash caused by the chickenpox virus (shingles).  Tumors that affect the back of the abdomen.  Gallbladder disease. Follow these instructions at home:   Drink enough fluid to keep your urine clear or pale yellow.  Rest as told by your health care provider.  Take over-the-counter and prescription medicines only as told by your health care provider.  Keep a journal to track what has caused your flank pain and what has made it feel better.  Keep all follow-up visits as told by your health care provider. This is important. Contact a health care provider if:  Your pain is not controlled with medicine.  You have new symptoms.  Your pain gets worse.  You have a fever.  Your symptoms last longer than 2-3 days.  You have trouble urinating or you are urinating very frequently. Get help right away if:  You have trouble breathing or you are short of breath.  Your abdomen hurts or it is swollen or red.  You have nausea or vomiting.  You feel faint or you pass out.  You have blood in your urine. Summary  Flank pain is pain that is located on the side of the body between the upper abdomen and the back.  The pain may occur over a short period of time (acute), or it may be long-term or recurring (chronic). It may be mild or severe.  Flank pain can be caused by many things.  Contact your health care provider if your symptoms get worse or they last  longer than 2-3 days. This information is not intended to replace advice given to you by your health care provider. Make sure you discuss any questions you have with your health care provider. Document Released: 05/22/2005 Document Revised: 03/13/2017 Document Reviewed: 06/13/2016 Elsevier Patient Education  2020 Elsevier Inc.  

## 2018-12-10 NOTE — Progress Notes (Signed)
HPI:                                                                Katherine Michael is a 23 y.o. female who [facility]: [facility] today for flank pain  Reports new onset pain in her mid-back yesterday while seated at work that has traveled to bilateral flank area. Denies known injury/trauma. She does not have a hx of back pain. Also having an intermittent burning sensation in her urethra at rest with cloudy urine starting yesterday.  Has had some nausea w/o vomiting and fatigue Denies fever, chills, malaise, myalgia/arthralgia, abdominal/pelvic pain, vaginal discharge, urinary urgency or frequency, hematuria, change in bowel habits. Sexually active with 1 female partner. No known exposure to STI. OCP for contraception, no missed pills No prior hx of pyelonephritis. Reports family hx of nephrolithiasis  Endorses palpitations that she attributes to anxiety. Denies chest pain or SOB. No hx of VTE.    Past Medical History:  Diagnosis Date  . Depression, major, single episode, mild ([facility]) 12/02/2018  . Obsessive-compulsive disorder 04/15/2007   Overview:  with anxiety-features   History reviewed. No pertinent surgical history. Social History   Tobacco Use  . Smoking status: Never Smoker  . Smokeless tobacco: Never Used  Substance Use Topics  . Alcohol use: No   family history includes Breast cancer in her maternal grandmother and paternal grandmother; Colon cancer in her maternal grandfather; Parkinson's disease in her paternal grandfather.    ROS: negative except as noted in the HPI  Medications: Current Outpatient Medications  Medication Sig Dispense Refill  . buPROPion (WELLBUTRIN XL) 150 MG 24 hr tablet Take 1 tablet (150 mg total) by mouth every morning. 90 tablet 1  . JUNEL FE 1/20 1-20 MG-MCG tablet   10  . sertraline (ZOLOFT) 100 MG tablet Take 1 tablet (100 mg total) by mouth daily. 90 tablet 3  . sertraline (ZOLOFT) 25 MG  tablet Take 1 tablet (25 mg total) by mouth daily. 90 tablet 1   No current facility-administered medications for this visit.    Allergies  Allergen Reactions  . Bactrim [Sulfamethoxazole-Trimethoprim] Rash       Objective:  BP 113/78   Pulse (!) 104   Temp 98.9 F (37.2 C)   LMP 11/17/2018 (Exact Date)   SpO2 97%   Wt Readings from Last 3 Encounters:  08/27/18 118 lb (53.5 kg)  01/18/18 119 lb (54 kg)  10/26/17 118 lb (53.5 kg)   Temp Readings from Last 3 Encounters:  12/10/18 98.9 F (37.2 C)  12/02/18 98.6 F (37 C)  10/20/18 98.1 F (36.7 C)   BP Readings from Last 3 Encounters:  12/10/18 113/78  01/18/18 111/71  10/26/17 108/62   Pulse Readings from Last 3 Encounters:  12/10/18 (!) 104  12/02/18 83  10/20/18 78    Gen:  alert, not ill-appearing, no distress, appropriate for age HEENT: head normocephalic without obvious abnormality, conjunctiva and cornea clear, trachea midline Pulm: Normal work of breathing, normal phonation, clear to auscultation bilaterally, no wheezes, rales or rhonchi CV: tachycardic at 100-104 bpm, regular rhythm, s1 and s2 distinct, no murmurs, clicks or rubs  GI: abdomen soft, there is RUQ tenderness with mild guarding and right CVA  tenderness Neuro: alert and oriented x 3, no tremor MSK: extremities atraumatic, normal gait and station Skin: intact, no rashes on exposed skin, no jaundice, no cyanosis Psych: well-groomed, cooperative, good eye contact, euthymic mood, affect mood-congruent, speech is articulate, and thought processes clear and goal-directed    Results for orders placed or performed in visit on 12/10/18 (from the past 72 hour(s))  POCT Urinalysis Dipstick     Status: None   Collection Time: 12/10/18  2:16 PM  Result Value Ref Range   Color, UA Yellow    Clarity, UA Clear    Glucose, UA Negative Negative   Bilirubin, UA Negative    Ketones, UA Negative    Spec Grav, UA 1.010 1.010 - 1.025   Blood, UA Negative     pH, UA 7.0 5.0 - 8.0   Protein, UA Negative Negative   Urobilinogen, UA 0.2 0.2 or 1.0 E.U./dL   Nitrite, UA Negative    Leukocytes, UA Negative Negative   Appearance     Odor     No results found.    Assessment and Plan: 23 y.o. female with   .Diagnoses and all orders for this visit:  Acute right flank pain -     Urine Culture -     POCT Urinalysis Dipstick -     POCT urine pregnancy -     Cancel: CT Abdomen Pelvis W Contrast -     CT ABDOMEN PELVIS W CONTRAST  Costovertebral angle tenderness -     Cancel: CT Abdomen Pelvis W Contrast -     CT ABDOMEN PELVIS W CONTRAST  Tachycardia with heart rate 100-120 beats per minute -     CT ABDOMEN PELVIS W CONTRAST   POC UA negative Urine hCG negative Given tachycardia, flank pain and presence of CVA tenderness and RUQ pain with guarding recommended patient for CT abdomen/pelvis today to r/o pyelonephritis  Urine culture pending   Patient education and anticipatory guidance given Patient agrees with treatment plan Follow-up based on CT results or sooner as needed if symptoms worsen or fail to improve  Darlyne Russian PA-C

## 2018-12-11 LAB — URINE CULTURE
MICRO NUMBER:: 823956
SPECIMEN QUALITY:: ADEQUATE

## 2018-12-15 ENCOUNTER — Other Ambulatory Visit: Payer: Self-pay | Admitting: Physician Assistant

## 2019-03-07 ENCOUNTER — Other Ambulatory Visit: Payer: Self-pay

## 2019-03-07 ENCOUNTER — Encounter: Payer: Self-pay | Admitting: Family Medicine

## 2019-03-07 ENCOUNTER — Telehealth (INDEPENDENT_AMBULATORY_CARE_PROVIDER_SITE_OTHER): Payer: Managed Care, Other (non HMO) | Admitting: Family Medicine

## 2019-03-07 VITALS — HR 78

## 2019-03-07 DIAGNOSIS — F429 Obsessive-compulsive disorder, unspecified: Secondary | ICD-10-CM | POA: Diagnosis not present

## 2019-03-07 DIAGNOSIS — F320 Major depressive disorder, single episode, mild: Secondary | ICD-10-CM

## 2019-03-07 NOTE — Progress Notes (Signed)
Virtual Visit via Video Note  I connected with Glenford Bayley on 03/07/19 at  7:30 AM EST by a video enabled telemedicine application and verified that I am speaking with the correct person using two identifiers.   I discussed the limitations of evaluation and management by telemedicine and the availability of in person appointments. The patient expressed understanding and agreed to proceed.  Subjective:    CC: F/U Mood  HPI: Follow-up major depressive disorder-last saw her 3 months ago in August.  She had felt like there had been some improvement in her mood when we added Wellbutrin.  She is still struggling a little bit with anxiety so we discussed several options.  We ended up increasing her Wellbutrin to 125 mg because in the past when she got up 250 mg she had actually felt too sedated.  She is very interested in seeing a therapist/counselor.  She said she looked around and looked online and just had a really hard time trying to find someone that she felt good about.  She feels like her sleep is fair.  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  She is finishing up some classes in the next couple weeks and she feels like that will be a big stress reliever.  She is then going to take a break from classes for the second semester.   Past medical history, Surgical history, Family history not pertinant except as noted below, Social history, Allergies, and medications have been entered into the medical record, reviewed, and corrections made.   Review of Systems: No fevers, chills, night sweats, weight loss, chest pain, or shortness of breath.   Objective:    General: Speaking clearly in complete sentences without any shortness of breath.  Alert and oriented x3.  Normal judgment. No apparent acute distress.    Impression and Recommendations:   MDD/OCD -refer to Francie Massing for behavioral therapy.  Patient seems very open to this.  We will continue current regimen with  Zoloft and Wellbutrin she does not want to change anything until her classes are over with plus she feels like finishing up his classes will reduce a lot of stress for her.  Plan will be to follow-up in 3 months.    I discussed the assessment and treatment plan with the patient. The patient was provided an opportunity to ask questions and all were answered. The patient agreed with the plan and demonstrated an understanding of the instructions.   The patient was advised to call back or seek an in-person evaluation if the symptoms worsen or if the condition fails to improve as anticipated.   Beatrice Lecher, MD

## 2019-04-20 ENCOUNTER — Ambulatory Visit (INDEPENDENT_AMBULATORY_CARE_PROVIDER_SITE_OTHER): Payer: 59 | Admitting: Professional

## 2019-04-20 DIAGNOSIS — F411 Generalized anxiety disorder: Secondary | ICD-10-CM | POA: Diagnosis not present

## 2019-05-05 ENCOUNTER — Ambulatory Visit (INDEPENDENT_AMBULATORY_CARE_PROVIDER_SITE_OTHER): Payer: 59 | Admitting: Professional

## 2019-05-05 DIAGNOSIS — F411 Generalized anxiety disorder: Secondary | ICD-10-CM | POA: Diagnosis not present

## 2019-05-19 ENCOUNTER — Ambulatory Visit (INDEPENDENT_AMBULATORY_CARE_PROVIDER_SITE_OTHER): Payer: 59 | Admitting: Professional

## 2019-05-19 DIAGNOSIS — F411 Generalized anxiety disorder: Secondary | ICD-10-CM | POA: Diagnosis not present

## 2019-06-02 ENCOUNTER — Ambulatory Visit (INDEPENDENT_AMBULATORY_CARE_PROVIDER_SITE_OTHER): Payer: 59 | Admitting: Professional

## 2019-06-02 DIAGNOSIS — F411 Generalized anxiety disorder: Secondary | ICD-10-CM | POA: Diagnosis not present

## 2019-06-03 ENCOUNTER — Other Ambulatory Visit: Payer: Self-pay | Admitting: *Deleted

## 2019-06-03 MED ORDER — SERTRALINE HCL 25 MG PO TABS: 25.0000 mg | ORAL_TABLET | Freq: Every day | ORAL | 0 refills | Status: DC

## 2019-06-13 ENCOUNTER — Ambulatory Visit (INDEPENDENT_AMBULATORY_CARE_PROVIDER_SITE_OTHER): Payer: 59 | Admitting: Professional

## 2019-06-13 DIAGNOSIS — F411 Generalized anxiety disorder: Secondary | ICD-10-CM

## 2019-06-15 ENCOUNTER — Other Ambulatory Visit: Payer: Self-pay | Admitting: *Deleted

## 2019-06-15 MED ORDER — BUPROPION HCL ER (XL) 150 MG PO TB24: 150.0000 mg | ORAL_TABLET | ORAL | 0 refills | Status: DC

## 2019-07-02 ENCOUNTER — Ambulatory Visit (INDEPENDENT_AMBULATORY_CARE_PROVIDER_SITE_OTHER): Payer: 59 | Admitting: Professional

## 2019-07-02 DIAGNOSIS — F411 Generalized anxiety disorder: Secondary | ICD-10-CM

## 2019-07-11 ENCOUNTER — Ambulatory Visit (INDEPENDENT_AMBULATORY_CARE_PROVIDER_SITE_OTHER): Payer: 59 | Admitting: Professional

## 2019-07-11 DIAGNOSIS — F411 Generalized anxiety disorder: Secondary | ICD-10-CM

## 2019-08-04 ENCOUNTER — Ambulatory Visit (INDEPENDENT_AMBULATORY_CARE_PROVIDER_SITE_OTHER): Payer: 59 | Admitting: Professional

## 2019-08-04 DIAGNOSIS — F411 Generalized anxiety disorder: Secondary | ICD-10-CM | POA: Diagnosis not present

## 2019-08-15 ENCOUNTER — Ambulatory Visit: Payer: 59 | Admitting: Professional

## 2019-08-30 ENCOUNTER — Other Ambulatory Visit: Payer: Self-pay | Admitting: Family Medicine

## 2019-08-30 ENCOUNTER — Ambulatory Visit (INDEPENDENT_AMBULATORY_CARE_PROVIDER_SITE_OTHER): Payer: 59 | Admitting: Professional

## 2019-08-30 DIAGNOSIS — F411 Generalized anxiety disorder: Secondary | ICD-10-CM | POA: Diagnosis not present

## 2019-09-02 ENCOUNTER — Other Ambulatory Visit: Payer: Self-pay | Admitting: *Deleted

## 2019-09-02 MED ORDER — SERTRALINE HCL 25 MG PO TABS: 25.0000 mg | ORAL_TABLET | Freq: Every day | ORAL | 0 refills | Status: DC

## 2019-09-05 ENCOUNTER — Other Ambulatory Visit: Payer: Self-pay | Admitting: *Deleted

## 2019-09-05 MED ORDER — BUPROPION HCL ER (XL) 150 MG PO TB24: 150.0000 mg | ORAL_TABLET | ORAL | 0 refills | Status: DC

## 2019-09-20 ENCOUNTER — Ambulatory Visit (INDEPENDENT_AMBULATORY_CARE_PROVIDER_SITE_OTHER): Payer: 59 | Admitting: Professional

## 2019-09-20 DIAGNOSIS — F411 Generalized anxiety disorder: Secondary | ICD-10-CM

## 2019-09-30 ENCOUNTER — Other Ambulatory Visit: Payer: Self-pay | Admitting: Family Medicine

## 2019-09-30 ENCOUNTER — Ambulatory Visit: Payer: 59 | Admitting: Professional

## 2019-11-10 ENCOUNTER — Ambulatory Visit (INDEPENDENT_AMBULATORY_CARE_PROVIDER_SITE_OTHER): Payer: 59 | Admitting: Professional

## 2019-11-10 DIAGNOSIS — F411 Generalized anxiety disorder: Secondary | ICD-10-CM

## 2019-12-06 ENCOUNTER — Other Ambulatory Visit: Payer: Self-pay | Admitting: Family Medicine

## 2019-12-06 ENCOUNTER — Encounter: Payer: Self-pay | Admitting: Family Medicine

## 2019-12-07 NOTE — Telephone Encounter (Signed)
Perfect

## 2019-12-09 ENCOUNTER — Other Ambulatory Visit: Payer: Self-pay | Admitting: Family Medicine

## 2019-12-12 ENCOUNTER — Other Ambulatory Visit: Payer: Self-pay

## 2019-12-12 ENCOUNTER — Encounter: Payer: Self-pay | Admitting: Family Medicine

## 2019-12-12 ENCOUNTER — Telehealth (INDEPENDENT_AMBULATORY_CARE_PROVIDER_SITE_OTHER): Payer: Managed Care, Other (non HMO) | Admitting: Family Medicine

## 2019-12-12 DIAGNOSIS — F429 Obsessive-compulsive disorder, unspecified: Secondary | ICD-10-CM

## 2019-12-12 DIAGNOSIS — F320 Major depressive disorder, single episode, mild: Secondary | ICD-10-CM

## 2019-12-12 MED ORDER — SERTRALINE HCL 100 MG PO TABS: 100.0000 mg | ORAL_TABLET | Freq: Every day | ORAL | 1 refills | Status: DC

## 2019-12-12 MED ORDER — BUPROPION HCL ER (XL) 150 MG PO TB24: 150.0000 mg | ORAL_TABLET | ORAL | 0 refills | Status: DC

## 2019-12-12 MED ORDER — SERTRALINE HCL 25 MG PO TABS: 25.0000 mg | ORAL_TABLET | Freq: Every day | ORAL | 1 refills | Status: DC

## 2019-12-12 NOTE — Assessment & Plan Note (Signed)
Discussed options.  I think weaning the Wellbutrin would be a good for step if she is wanting to decrease medications overall she feels like that was added when she was at the height of her stress and feels like things are much better.  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  We discussed continuing the sertraline as well.

## 2019-12-12 NOTE — Progress Notes (Signed)
Virtual Visit via Video Note  I connected with Katherine Michael on 12/12/19 at  7:10 AM EDT by a video enabled telemedicine application and verified that I am speaking with the correct person using two identifiers.   I discussed the limitations of evaluation and management by telemedicine and the availability of in person appointments. The patient expressed understanding and agreed to proceed.  Patient location: at home  Provider location: in office       Established Patient Office Visit  Subjective:  Patient ID: Katherine Michael, female    DOB: [birth year]  Age: 24 y.o. MRN: [id number]  CC: No chief complaint on file.   HPI Katherine Michael presents for follow-up major depressive disorder.  I last saw her in November 2020 to follow-up her mood.  At that time I actually referred her to [facility] for counseling therapy.  And she was taking Zoloft and Wellbutrin at the time.  She is actually doing really well she is is working with Olegario Messier in setting some goals has really helped her move through some things that were really causing a lot of stress she has been journaling and says that is actually been helpful to be able to look back and realized that situations that were really impacting her 6 months ago are really almost nonexistent.  She also changed jobs at the hospital and says that was actually helpful as well to reduce a lot of her stress levels.  She is otherwise doing well on the medication but says she would like to consider tapering the Wellbutrin.  She has had the COVID vaccine.  He also let me know that she came off of oral birth control in March and has a [place] IUD in place.  Updated her chart.  Past Medical History:  Diagnosis Date  . Depression, major, single episode, mild (HCC) 12/02/2018  . Obsessive-compulsive disorder 04/15/2007   Overview:  with anxiety-features    History reviewed. No pertinent surgical history.  Family History  Problem Relation Age of Onset  .  Breast cancer Maternal Grandmother   . Colon cancer Maternal Grandfather   . Breast cancer Paternal Grandmother   . Parkinson's disease Paternal Grandfather     Social History   Socioeconomic History  . Marital status: Married    Spouse name: Not on file  . Number of children: Not on file  . Years of education: some college  . Highest education level: Not on file  Occupational History  . Occupation: cna    Employer: [employer]  Tobacco Use  . Smoking status: Never Smoker  . Smokeless tobacco: Never Used  Substance and Sexual Activity  . Alcohol use: No  . Drug use: No  . Sexual activity: Yes    Partners: Male  Other Topics Concern  . Not on file  Social History Narrative  . Not on file   Social Determinants of Health   Financial Resource Strain:   . Difficulty of Paying Living Expenses: Not on file  Food Insecurity:   . Worried About Programme researcher, broadcasting/film/video in the Last Year: Not on file  . Ran Out of Food in the Last Year: Not on file  Transportation Needs:   . Lack of Transportation (Medical): Not on file  . Lack of Transportation (Non-Medical): Not on file  Physical Activity:   . Days of Exercise per Week: Not on file  . Minutes of Exercise per Session: Not on file  Stress:   . Feeling of Stress :  Not on file  Social Connections:   . Frequency of Communication with Friends and Family: Not on file  . Frequency of Social Gatherings with Friends and Family: Not on file  . Attends Religious Services: Not on file  . Active Member of Clubs or Organizations: Not on file  . Attends Banker Meetings: Not on file  . Marital Status: Not on file  Intimate Partner Violence:   . Fear of Current or Ex-Partner: Not on file  . Emotionally Abused: Not on file  . Physically Abused: Not on file  . Sexually Abused: Not on file    Outpatient Medications Prior to Visit  Medication Sig Dispense Refill  . levonorgestrel (KYLEENA) 19.5 MG IUD by Intrauterine route  once. March 2021    . cetirizine (ZYRTEC) 10 MG tablet Take 1 tablet by mouth daily.    Marland Kitchen buPROPion (WELLBUTRIN XL) 150 MG 24 hr tablet Take 1 tablet (150 mg total) by mouth every morning. LAST REFILL.MUST SCHEDULE AND KEEP APPOINTMENT FOR REFILLS. 90 tablet 0  . JUNEL FE 1/20 1-20 MG-MCG tablet   10  . sertraline (ZOLOFT) 100 MG tablet Take 1 tablet (100 mg total) by mouth daily. Needs appt 90 tablet 0  . sertraline (ZOLOFT) 25 MG tablet Take 1 tablet (25 mg total) by mouth daily. LAST REFILL. MUST SCHEDULE AND KEEP APPOINTMENT FOR REFILL. 90 tablet 0   No facility-administered medications prior to visit.    Allergies  Allergen Reactions  . Bactrim [Sulfamethoxazole-Trimethoprim] Rash    ROS Review of Systems    Objective:   There were no vitals taken for this visit. Wt Readings from Last 3 Encounters:  08/27/18 118 lb (53.5 kg)  01/18/18 119 lb (54 kg)  10/26/17 118 lb (53.5 kg)     Health Maintenance Due  Topic Date Due  . Hepatitis C Screening  Never done  . INFLUENZA VACCINE  11/13/2019  . PAP-Cervical Cytology Screening  01/09/2020  . PAP SMEAR-Modifier  01/09/2020    There are no preventive care reminders to display for this patient.  Lab Results  Component Value Date   TSH 0.84 10/26/2017   Lab Results  Component Value Date   WBC 6.0 10/26/2017   HGB 14.4 10/26/2017   HCT 42.4 10/26/2017   MCV 90.2 10/26/2017   PLT 295 10/26/2017   Lab Results  Component Value Date   NA 140 10/26/2017   K 3.9 10/26/2017   CO2 26 10/26/2017   GLUCOSE 104 (H) 10/26/2017   BUN 10 10/26/2017   CREATININE 0.73 10/26/2017   CALCIUM 10.0 10/26/2017   No results found for: CHOL No results found for: HDL No results found for: LDLCALC No results found for: TRIG No results found for: CHOLHDL No results found for: SEGB1D    Assessment & Plan:   Problem List Items Addressed This Visit      Other   Obsessive-compulsive disorder    We will continue the sertraline at  125 mg total right now that has worked well for her we did discuss that if any point she was to try to decrease her sertraline we could certainly consider it but it may allow some of her OCD tendencies to creep back in so that she is something we would have to watch really well for.      Relevant Medications   buPROPion (WELLBUTRIN XL) 150 MG 24 hr tablet   sertraline (ZOLOFT) 100 MG tablet   sertraline (ZOLOFT) 25 MG tablet  Depression, major, single episode, mild (HCC) - Primary    Discussed options.  I think weaning the Wellbutrin would be a good for step if she is wanting to decrease medications overall she feels like that was added when she was at the height of her stress and feels like things are much better.  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  We discussed continuing the sertraline as well.      Relevant Medications   buPROPion (WELLBUTRIN XL) 150 MG 24 hr tablet   sertraline (ZOLOFT) 100 MG tablet   sertraline (ZOLOFT) 25 MG tablet      Meds ordered this encounter  Medications  . buPROPion (WELLBUTRIN XL) 150 MG 24 hr tablet    Sig: Take 1 tablet (150 mg total) by mouth every morning.    Dispense:  90 tablet    Refill:  0  . sertraline (ZOLOFT) 100 MG tablet    Sig: Take 1 tablet (100 mg total) by mouth daily.    Dispense:  90 tablet    Refill:  1  . sertraline (ZOLOFT) 25 MG tablet    Sig: Take 1 tablet (25 mg total) by mouth daily.    Dispense:  90 tablet    Refill:  1   Reminded her that her Pap smear is coming up next month and to get that scheduled.  She will get the flu vaccine through work.  Follow-up: No follow-ups on file.     Time spent in encounter 20 minutes  I discussed the assessment and treatment plan with  the patient. The patient was provided an opportunity to ask questions and all were answered. The patient agreed with the plan and demonstrated an understanding of the instructions.   The patient was advised to call back or seek an in-person evaluation if the symptoms worsen or if the condition fails to improve as anticipated.    Nani Gasser, MD

## 2019-12-12 NOTE — Assessment & Plan Note (Signed)
We will continue the sertraline at 125 mg total right now that has worked well for her we did discuss that if any point she was to try to decrease her sertraline we could certainly consider it but it may allow some of her OCD tendencies to creep back in so that she is something we would have to watch really well for.

## 2020-01-23 ENCOUNTER — Ambulatory Visit (INDEPENDENT_AMBULATORY_CARE_PROVIDER_SITE_OTHER): Payer: Managed Care, Other (non HMO) | Admitting: Nurse Practitioner

## 2020-01-23 ENCOUNTER — Encounter: Payer: Self-pay | Admitting: Nurse Practitioner

## 2020-01-23 VITALS — BP 103/61 | HR 82 | Ht 64.0 in | Wt 124.0 lb

## 2020-01-23 DIAGNOSIS — N3001 Acute cystitis with hematuria: Secondary | ICD-10-CM | POA: Insufficient documentation

## 2020-01-23 LAB — POCT URINALYSIS DIP (CLINITEK)
Bilirubin, UA: NEGATIVE
Glucose, UA: NEGATIVE mg/dL
Ketones, POC UA: NEGATIVE mg/dL
Leukocytes, UA: NEGATIVE
Nitrite, UA: NEGATIVE
POC PROTEIN,UA: NEGATIVE
Spec Grav, UA: 1.01 (ref 1.010–1.025)
Urobilinogen, UA: 0.2 E.U./dL
pH, UA: 7 (ref 5.0–8.0)

## 2020-01-23 MED ORDER — NITROFURANTOIN MONOHYD MACRO 100 MG PO CAPS: 100.0000 mg | ORAL_CAPSULE | Freq: Two times a day (BID) | ORAL | 0 refills | Status: DC

## 2020-01-23 NOTE — Progress Notes (Signed)
Acute Office Visit  Subjective:    Patient ID: Katherine Michael, female    DOB: 1996/02/02, 24 y.o.   MRN: [id number]  Chief Complaint  Patient presents with  . Dysuria  . Urinary Frequency  . Urinary Urgency    HPI Katherine Michael is a 24 year old patient presenting today for evaluation of urinary symptoms. She reports the symptoms were so severe over the weekend she started taking AZO and she did take a dose of macrobid from an old prescription. She reports after taking both of these her symptoms have improved dramatically, but are still present.   URINARY SYMPTOMS Dysuria: burning Urinary frequency: yes Urgency: yes Small volume voids: yes Incomplete emptying: Yes Symptom severity: 8- at it's worst yesterday prior to AZO and macrobid Urinary incontinence: no Foul odor: yes Hematuria: yes Abdominal pain: no Back pain: no Suprapubic pain/pressure: no Flank pain: no Fever:  no Nausea: Yes - when symptoms at their worst Diarrhea: No  Vomiting: no Status: Improved after taking AZO and macrobid yesterday Previous urinary tract infection: yes Recurrent urinary tract infection: no Sexual activity: Current History of sexually transmitted disease: no- no concerns for STI today  If yes, when: NA  Was a test of cure performed: NA Treatments attempted: pyridium   Have any treatments helped symptoms: yes  Past Medical History:  Diagnosis Date  . Depression, major, single episode, mild (HCC) 12/02/2018  . Obsessive-compulsive disorder 04/15/2007   Overview:  with anxiety-features    History reviewed. No pertinent surgical history.  Family History  Problem Relation Age of Onset  . Breast cancer Maternal Grandmother   . Colon cancer Maternal Grandfather   . Breast cancer Paternal Grandmother   . Parkinson's disease Paternal Grandfather     Social History   Socioeconomic History  . Marital status: Married    Spouse name: Not on file  . Number of children: Not on file  .  Years of education: some college  . Highest education level: Not on file  Occupational History  . Occupation: cna    Employer: [employer]  Tobacco Use  . Smoking status: Never Smoker  . Smokeless tobacco: Never Used  Substance and Sexual Activity  . Alcohol use: No  . Drug use: No  . Sexual activity: Yes    Partners: Male  Other Topics Concern  . Not on file  Social History Narrative  . Not on file   Social Determinants of Health   Financial Resource Strain:   . Difficulty of Paying Living Expenses: Not on file  Food Insecurity:   . Worried About Programme researcher, broadcasting/film/video in the Last Year: Not on file  . Ran Out of Food in the Last Year: Not on file  Transportation Needs:   . Lack of Transportation (Medical): Not on file  . Lack of Transportation (Non-Medical): Not on file  Physical Activity:   . Days of Exercise per Week: Not on file  . Minutes of Exercise per Session: Not on file  Stress:   . Feeling of Stress : Not on file  Social Connections:   . Frequency of Communication with Friends and Family: Not on file  . Frequency of Social Gatherings with Friends and Family: Not on file  . Attends Religious Services: Not on file  . Active Member of Clubs or Organizations: Not on file  . Attends Banker Meetings: Not on file  . Marital Status: Not on file  Intimate Partner Violence:   . Fear  of Current or Ex-Partner: Not on file  . Emotionally Abused: Not on file  . Physically Abused: Not on file  . Sexually Abused: Not on file    Outpatient Medications Prior to Visit  Medication Sig Dispense Refill  . buPROPion (WELLBUTRIN XL) 150 MG 24 hr tablet Take 1 tablet (150 mg total) by mouth every morning. 90 tablet 0  . cetirizine (ZYRTEC) 10 MG tablet Take 1 tablet by mouth daily.    Marland Kitchen levonorgestrel (KYLEENA) 19.5 MG IUD by Intrauterine route once. March 2021    . sertraline (ZOLOFT) 100 MG tablet Take 1 tablet (100 mg total) by mouth daily. 90 tablet 1  .  sertraline (ZOLOFT) 25 MG tablet Take 1 tablet (25 mg total) by mouth daily. 90 tablet 1   No facility-administered medications prior to visit.    Allergies  Allergen Reactions  . Bactrim [Sulfamethoxazole-Trimethoprim] Rash    Review of Systems See HPI    Objective:    Physical Exam Vitals and nursing note reviewed.  Constitutional:      Appearance: Normal appearance. She is normal weight.  HENT:     Head: Normocephalic.  Eyes:     Extraocular Movements: Extraocular movements intact.     Conjunctiva/sclera: Conjunctivae normal.     Pupils: Pupils are equal, round, and reactive to light.  Cardiovascular:     Rate and Rhythm: Normal rate and regular rhythm.     Pulses: Normal pulses.     Heart sounds: Normal heart sounds.  Pulmonary:     Effort: Pulmonary effort is normal.     Breath sounds: Normal breath sounds.  Abdominal:     General: Abdomen is flat. Bowel sounds are normal. There is no distension.     Palpations: Abdomen is soft.     Tenderness: There is no abdominal tenderness. There is no right CVA tenderness, left CVA tenderness or guarding.  Musculoskeletal:        General: Normal range of motion.     Cervical back: Normal range of motion.     Right lower leg: No edema.     Left lower leg: No edema.  Skin:    General: Skin is warm and dry.     Capillary Refill: Capillary refill takes less than 2 seconds.  Neurological:     General: No focal deficit present.     Mental Status: She is alert and oriented to person, place, and time.  Psychiatric:        Mood and Affect: Mood normal.        Behavior: Behavior normal.        Thought Content: Thought content normal.        Judgment: Judgment normal.       BP 103/61   Pulse 82   Ht 5\' 4"  (1.626 m)   Wt 124 lb (56.2 kg)   SpO2 98%   BMI 21.28 kg/m  Wt Readings from Last 3 Encounters:  01/23/20 124 lb (56.2 kg)  08/27/18 118 lb (53.5 kg)  01/18/18 119 lb (54 kg)    Health Maintenance Due  Topic Date  Due  . Hepatitis C Screening  Never done  . INFLUENZA VACCINE  11/13/2019  . PAP-Cervical Cytology Screening  01/09/2020  . PAP SMEAR-Modifier  01/09/2020    There are no preventive care reminders to display for this patient.   Lab Results  Component Value Date   TSH 0.84 10/26/2017   Lab Results  Component Value Date   WBC  6.0 10/26/2017   HGB 14.4 10/26/2017   HCT 42.4 10/26/2017   MCV 90.2 10/26/2017   PLT 295 10/26/2017   Lab Results  Component Value Date   NA 140 10/26/2017   K 3.9 10/26/2017   CO2 26 10/26/2017   GLUCOSE 104 (H) 10/26/2017   BUN 10 10/26/2017   CREATININE 0.73 10/26/2017   CALCIUM 10.0 10/26/2017   No results found for: CHOL No results found for: HDL No results found for: LDLCALC No results found for: TRIG No results found for: CHOLHDL No results found for: WVPX1G     Assessment & Plan:   Problem List Items Addressed This Visit      Genitourinary   Acute cystitis with hematuria - Primary    Symptoms and presentation consistent with acute cystitis.  POC UA today does show the presence of blood, but negative for nitrites or leukocytes. Given the patients recent use of AZO and prescription macrobid, this has likely reduced the presence in the urine.  We will treat today with 5 days of macrobid and send the urine for culture.  Recommend increase water intake. Patient may continue to take AZO as directed on the packaging.  Follow-up if symptoms worsen or fail to improve.       Relevant Medications   nitrofurantoin, macrocrystal-monohydrate, (MACROBID) 100 MG capsule   Other Relevant Orders   POCT URINALYSIS DIP (CLINITEK) (Completed)   Urine Culture       Meds ordered this encounter  Medications  . nitrofurantoin, macrocrystal-monohydrate, (MACROBID) 100 MG capsule    Sig: Take 1 capsule (100 mg total) by mouth 2 (two) times daily.    Dispense:  10 capsule    Refill:  0   Follow-up if symptoms worsen or fail to improve.   Tollie Eth, NP

## 2020-01-23 NOTE — Patient Instructions (Signed)
You can continue to take the AZO for a total of three days to help with your symptoms.   Be sure to increase your water intake to help flush the kidneys and bladder.   If your symptoms do not improve or improve, but then get worse again, please let us know.   Urinary Tract Infection, Adult  A urinary tract infection (UTI) is an infection of any part of the urinary tract. The urinary tract includes the kidneys, ureters, bladder, and urethra. These organs make, store, and get rid of urine in the body. Your health care provider may use other names to describe the infection. An upper UTI affects the ureters and kidneys (pyelonephritis). A lower UTI affects the bladder (cystitis) and urethra (urethritis). What are the causes? Most urinary tract infections are caused by bacteria in your genital area, around the entrance to your urinary tract (urethra). These bacteria grow and cause inflammation of your urinary tract. What increases the risk? You are more likely to develop this condition if:  You have a urinary catheter that stays in place (indwelling).  You are not able to control when you urinate or have a bowel movement (you have incontinence).  You are female and you: ? Use a spermicide or diaphragm for birth control. ? Have low estrogen levels. ? Are pregnant.  You have certain genes that increase your risk (genetics).  You are sexually active.  You take antibiotic medicines.  You have a condition that causes your flow of urine to slow down, such as: ? An enlarged prostate, if you are female. ? Blockage in your urethra (stricture). ? A kidney stone. ? A nerve condition that affects your bladder control (neurogenic bladder). ? Not getting enough to drink, or not urinating often.  You have certain medical conditions, such as: ? Diabetes. ? A weak disease-fighting system (immunesystem). ? Sickle cell disease. ? Gout. ? Spinal cord injury. What are the signs or symptoms? Symptoms  of this condition include:  Needing to urinate right away (urgently).  Frequent urination or passing small amounts of urine frequently.  Pain or burning with urination.  Blood in the urine.  Urine that smells bad or unusual.  Trouble urinating.  Cloudy urine.  Vaginal discharge, if you are female.  Pain in the abdomen or the lower back. You may also have:  Vomiting or a decreased appetite.  Confusion.  Irritability or tiredness.  A fever.  Diarrhea. The first symptom in older adults may be confusion. In some cases, they may not have any symptoms until the infection has worsened. How is this diagnosed? This condition is diagnosed based on your medical history and a physical exam. You may also have other tests, including:  Urine tests.  Blood tests.  Tests for sexually transmitted infections (STIs). If you have had more than one UTI, a cystoscopy or imaging studies may be done to determine the cause of the infections. How is this treated? Treatment for this condition includes:  Antibiotic medicine.  Over-the-counter medicines to treat discomfort.  Drinking enough water to stay hydrated. If you have frequent infections or have other conditions such as a kidney stone, you may need to see a health care provider who specializes in the urinary tract (urologist). In rare cases, urinary tract infections can cause sepsis. Sepsis is a life-threatening condition that occurs when the body responds to an infection. Sepsis is treated in the hospital with IV antibiotics, fluids, and other medicines. Follow these instructions at home:  Medicines  Take over-the-counter and prescription medicines only as told by your health care provider.  If you were prescribed an antibiotic medicine, take it as told by your health care provider. Do not stop using the antibiotic even if you start to feel better. General instructions  Make sure you: ? Empty your bladder often and completely.  Do not hold urine for long periods of time. ? Empty your bladder after sex. ? Wipe from front to back after a bowel movement if you are female. Use each tissue one time when you wipe.  Drink enough fluid to keep your urine pale yellow.  Keep all follow-up visits as told by your health care provider. This is important. Contact a health care provider if:  Your symptoms do not get better after 1-2 days.  Your symptoms go away and then return. Get help right away if you have:  Severe pain in your back or your lower abdomen.  A fever.  Nausea or vomiting. Summary  A urinary tract infection (UTI) is an infection of any part of the urinary tract, which includes the kidneys, ureters, bladder, and urethra.  Most urinary tract infections are caused by bacteria in your genital area, around the entrance to your urinary tract (urethra).  Treatment for this condition often includes antibiotic medicines.  If you were prescribed an antibiotic medicine, take it as told by your health care provider. Do not stop using the antibiotic even if you start to feel better.  Keep all follow-up visits as told by your health care provider. This is important. This information is not intended to replace advice given to you by your health care provider. Make sure you discuss any questions you have with your health care provider. Document Revised: 03/18/2018 Document Reviewed: 10/08/2017 Elsevier Patient Education  2020 [employer].

## 2020-01-23 NOTE — Assessment & Plan Note (Signed)
Symptoms and presentation consistent with acute cystitis.  POC UA today does show the presence of blood, but negative for nitrites or leukocytes. Given the patients recent use of AZO and prescription macrobid, this has likely reduced the presence in the urine.  We will treat today with 5 days of macrobid and send the urine for culture.  Recommend increase water intake. Patient may continue to take AZO as directed on the packaging.  Follow-up if symptoms worsen or fail to improve.

## 2020-01-25 LAB — URINE CULTURE
MICRO NUMBER:: 11058673
SPECIMEN QUALITY:: ADEQUATE

## 2020-01-25 NOTE — Progress Notes (Signed)
Urine culture showed growth of streptococcus, which is a common cause of UTI symptoms in women. The antibiotic prescribed is shown to be effective against this bacteria. Please let us know if you continue to have symptoms after finishing the antibiotic.

## 2020-02-07 LAB — HM PAP SMEAR

## 2020-03-08 ENCOUNTER — Other Ambulatory Visit: Payer: Self-pay | Admitting: Family Medicine

## 2020-03-08 DIAGNOSIS — F320 Major depressive disorder, single episode, mild: Secondary | ICD-10-CM

## 2020-03-08 DIAGNOSIS — F429 Obsessive-compulsive disorder, unspecified: Secondary | ICD-10-CM

## 2020-03-09 ENCOUNTER — Other Ambulatory Visit: Payer: Self-pay | Admitting: *Deleted

## 2020-06-20 ENCOUNTER — Telehealth: Payer: Self-pay | Admitting: *Deleted

## 2020-06-20 DIAGNOSIS — F320 Major depressive disorder, single episode, mild: Secondary | ICD-10-CM

## 2020-06-20 DIAGNOSIS — F429 Obsessive-compulsive disorder, unspecified: Secondary | ICD-10-CM

## 2020-06-20 NOTE — Telephone Encounter (Signed)
Please call pt and have her schedule a f/u for her sertraline.

## 2020-06-20 NOTE — Telephone Encounter (Signed)
Called patient back to schedule a f/u for her sertraline. LVM for her to call and schedule an appt. tvt

## 2020-06-22 MED ORDER — SERTRALINE HCL 100 MG PO TABS: 100.0000 mg | ORAL_TABLET | Freq: Every day | ORAL | 0 refills | Status: DC

## 2020-06-22 MED ORDER — SERTRALINE HCL 25 MG PO TABS: 25.0000 mg | ORAL_TABLET | Freq: Every day | ORAL | 0 refills | Status: DC

## 2020-06-22 NOTE — Telephone Encounter (Signed)
Called patient again to schedule an appt. for f/u. No answer,LVM on cell phone, I will continue to call this patient. tvt

## 2020-07-20 ENCOUNTER — Emergency Department: Admit: 2020-07-20 | Discharge status: Absent without leave | Payer: Self-pay | Source: Home / Self Care

## 2020-07-20 ENCOUNTER — Other Ambulatory Visit: Payer: Self-pay

## 2020-07-20 ENCOUNTER — Emergency Department (INDEPENDENT_AMBULATORY_CARE_PROVIDER_SITE_OTHER)
Admission: EM | Admit: 2020-07-20 | Discharge: 2020-07-20 | Disposition: A | Discharge status: Home / Self Care | Payer: Managed Care, Other (non HMO) | Source: Home / Self Care

## 2020-07-20 DIAGNOSIS — R300 Dysuria: Secondary | ICD-10-CM | POA: Diagnosis not present

## 2020-07-20 DIAGNOSIS — N3001 Acute cystitis with hematuria: Secondary | ICD-10-CM | POA: Diagnosis not present

## 2020-07-20 LAB — POCT URINALYSIS DIP (MANUAL ENTRY)
Glucose, UA: 100 mg/dL — AB
Nitrite, UA: POSITIVE — AB
Protein Ur, POC: >=300 mg/dL — AB
Spec Grav, UA: 1.015 (ref 1.010–1.025)
Urobilinogen, UA: 4 E.U./dL — AB
pH, UA: 8.5 — AB (ref 5.0–8.0)

## 2020-07-20 LAB — POCT URINE PREGNANCY: Preg Test, Ur: NEGATIVE

## 2020-07-20 MED ORDER — NITROFURANTOIN MONOHYD MACRO 100 MG PO CAPS: 100.0000 mg | ORAL_CAPSULE | Freq: Two times a day (BID) | ORAL | 0 refills | Status: DC

## 2020-07-20 MED ORDER — ONDANSETRON HCL 4 MG PO TABS
4.0000 mg | ORAL_TABLET | Freq: Four times a day (QID) | ORAL | 0 refills | Status: DC
Start: 2020-07-20 — End: 2020-09-11

## 2020-07-20 NOTE — ED Provider Notes (Addendum)
[facility]    CSN: [id number] Arrival date & time: 07/20/20  1842      History   Chief Complaint Chief Complaint  Patient presents with  . Dysuria    HPI Katherine Michael is a 25 y.o. female.   HPI  Katherine Michael is a 25 y.o. female presents for evaluation of urinary frequency, urgency, pelvic pain and dysuria x 1 days. Mild nausea without vomiting this afternoon. She had sexual intercourse last night and subsequently developed symptoms. Last UTI in November resolved with Macrobid. Spots occasionally from IUD. No LMP recorded. (Menstrual status: IUD).   Past Medical History:  Diagnosis Date  . Depression, major, single episode, mild (HCC) 12/02/2018  . Obsessive-compulsive disorder 04/15/2007   Overview:  with anxiety-features    Patient Active Problem List   Diagnosis Date Noted  . Acute cystitis with hematuria 01/23/2020  . Depression, major, single episode, mild (HCC) 12/02/2018  . Obsessive-compulsive disorder 04/15/2007    History reviewed. No pertinent surgical history.  OB History   No obstetric history on file.      Home Medications    Prior to Admission medications   Medication Sig Start Date End Date Taking? Authorizing Provider  nitrofurantoin, macrocrystal-monohydrate, (MACROBID) 100 MG capsule Take 1 capsule (100 mg total) by mouth 2 (two) times daily. 07/20/20  Yes Bing Neighbors, FNP  ondansetron (ZOFRAN) 4 MG tablet Take 1 tablet (4 mg total) by mouth every 6 (six) hours. 07/20/20  Yes Bing Neighbors, FNP  cetirizine (ZYRTEC) 10 MG tablet Take 1 tablet by mouth daily. 04/03/18   [provider]  levonorgestrel (KYLEENA) 19.5 MG IUD by Intrauterine route once. March 2021    [provider]  sertraline (ZOLOFT) 100 MG tablet Take 1 tablet (100 mg total) by mouth daily. 06/22/20   Agapito Games, MD  sertraline (ZOLOFT) 25 MG tablet Take 1 tablet (25 mg total) by mouth daily. 06/22/20   Agapito Games, MD     Family History Family History  Problem Relation Age of Onset  . Breast cancer Maternal Grandmother   . Colon cancer Maternal Grandfather   . Breast cancer Paternal Grandmother   . Parkinson's disease Paternal Grandfather     Social History Social History   Tobacco Use  . Smoking status: Never Smoker  . Smokeless tobacco: Never Used  Substance Use Topics  . Alcohol use: No  . Drug use: No     Allergies   Bactrim [sulfamethoxazole-trimethoprim]   Review of Systems Review of Systems Pertinent negatives listed in HPI   Physical Exam Triage Vital Signs ED Triage Vitals  Enc Vitals Group     BP 07/20/20 1852 129/76     Pulse Rate 07/20/20 1852 74     Resp 07/20/20 1852 17     Temp 07/20/20 1852 99 F (37.2 C)     Temp Source 07/20/20 1852 Oral     SpO2 07/20/20 1852 96 %     Weight --      Height --      Head Circumference --      Peak Flow --      Pain Score 07/20/20 1853 5     Pain Loc --      Pain Edu? --      Excl. in GC? --    No data found.  Updated Vital Signs BP 129/76 (BP Location: Right Arm)   Pulse 74   Temp 99 F (37.2 C) (Oral)  Resp 17   SpO2 96%   Visual Acuity Right Eye Distance:   Left Eye Distance:   Bilateral Distance:    Right Eye Near:   Left Eye Near:    Bilateral Near:     Physical Exam General appearance: Alert, well developed, well nourished, cooperative Head: Normocephalic, without obvious abnormality, atraumatic Respiratory: Respirations even and unlabored, normal respiratory rate Heart: rate and rhythm normal. No gallop or murmurs noted on exam  Abdomen: BS +, no distention, pelvic tenderness mid region, no rebound tenderness, or no CVA tenderness  Extremities: No gross deformities Skin: Skin color, texture, turgor normal. No rashes seen  Psych: Appropriate mood and affect. Neurologic: GCS 15 , normal coordination, normal gait   UC Treatments / Results  Labs (all labs ordered are listed, but only abnormal  results are displayed) Labs Reviewed  POCT URINALYSIS DIP (MANUAL ENTRY) - Abnormal; Notable for the following components:      Result Value   Color, UA orange (*)    Clarity, UA cloudy (*)    Glucose, UA =100 (*)    Bilirubin, UA moderate (*)    Ketones, POC UA trace (5) (*)    Blood, UA large (*)    pH, UA 8.5 (*)    Protein Ur, POC >=300 (*)    Urobilinogen, UA 4.0 (*)    Nitrite, UA Positive (*)    Leukocytes, UA Large (3+) (*)    All other components within normal limits  URINE CULTURE  POCT URINE PREGNANCY    EKG   Radiology No results found.  Procedures Procedures (including critical care time)  Medications Ordered in UC Medications - No data to display  Initial Impression / Assessment and Plan / UC Course  I have reviewed the triage vital signs and the nursing notes.  Pertinent labs & imaging results that were available during my care of the patient were reviewed by me and considered in my medical decision making (see chart for details).      UA abnormal and findings consistent with UTI. Empiric antibiotic treatment initiated. Encouraged increase intake of water.Continue AZO wiping from front to back, and urinating prior to and after cortis to reduce risk for reinfection. Urine culture pending. Zofran for nausea. ER if symptoms become severe. Follow-up with PCP if symptoms do not completely resolve.  Final Clinical Impressions(s) / UC Diagnoses   Final diagnoses:  Dysuria  Acute cystitis with hematuria     Discharge Instructions     Continue Azo and you may also add Tylenol and or ibuprofen for management of lower pelvic pain. Have also sent over some Zofran in the event that you experience any further episodes of nausea.  Complete entire course of antibiotics.  Urine culture is pending however treating you based on your last urine culture results.    ED Prescriptions    Medication Sig Dispense Auth. Provider   nitrofurantoin,  macrocrystal-monohydrate, (MACROBID) 100 MG capsule Take 1 capsule (100 mg total) by mouth 2 (two) times daily. 20 capsule Bing Neighbors, FNP   ondansetron (ZOFRAN) 4 MG tablet Take 1 tablet (4 mg total) by mouth every 6 (six) hours. 12 tablet Bing Neighbors, FNP     PDMP not reviewed this encounter.   Bing Neighbors, FNP 07/20/20 2146    Bing Neighbors, FNP 07/20/20 2146

## 2020-07-20 NOTE — ED Triage Notes (Signed)
Pt c/o dysuria that started today. Also strong urine odor. Some pelvic pain and nausea. 2 AZO around 3pm. Pain 5/10 when hurting.

## 2020-07-20 NOTE — Discharge Instructions (Addendum)
Continue Azo and you may also add Tylenol and or ibuprofen for management of lower pelvic pain. Have also sent over some Zofran in the event that you experience any further episodes of nausea.  Complete entire course of antibiotics.  Urine culture is pending however treating you based on your last urine culture results.

## 2020-07-22 LAB — URINE CULTURE
MICRO NUMBER:: 11751094
SPECIMEN QUALITY:: ADEQUATE

## 2020-07-23 ENCOUNTER — Other Ambulatory Visit: Payer: Self-pay | Admitting: Family Medicine

## 2020-07-23 DIAGNOSIS — F320 Major depressive disorder, single episode, mild: Secondary | ICD-10-CM

## 2020-07-23 DIAGNOSIS — F429 Obsessive-compulsive disorder, unspecified: Secondary | ICD-10-CM

## 2020-07-29 ENCOUNTER — Other Ambulatory Visit: Payer: Self-pay | Admitting: Family Medicine

## 2020-07-29 DIAGNOSIS — F320 Major depressive disorder, single episode, mild: Secondary | ICD-10-CM

## 2020-07-29 DIAGNOSIS — F429 Obsessive-compulsive disorder, unspecified: Secondary | ICD-10-CM

## 2020-08-24 ENCOUNTER — Other Ambulatory Visit: Payer: Self-pay | Admitting: Family Medicine

## 2020-08-24 DIAGNOSIS — F320 Major depressive disorder, single episode, mild: Secondary | ICD-10-CM

## 2020-08-24 DIAGNOSIS — F429 Obsessive-compulsive disorder, unspecified: Secondary | ICD-10-CM

## 2020-09-02 ENCOUNTER — Other Ambulatory Visit: Payer: Self-pay | Admitting: Family Medicine

## 2020-09-02 DIAGNOSIS — F320 Major depressive disorder, single episode, mild: Secondary | ICD-10-CM

## 2020-09-02 DIAGNOSIS — F429 Obsessive-compulsive disorder, unspecified: Secondary | ICD-10-CM

## 2020-09-04 ENCOUNTER — Encounter: Payer: Self-pay | Admitting: Family Medicine

## 2020-09-04 MED ORDER — SERTRALINE HCL 100 MG PO TABS: ORAL_TABLET | ORAL | 0 refills | Status: DC

## 2020-09-05 ENCOUNTER — Other Ambulatory Visit: Payer: Self-pay | Admitting: *Deleted

## 2020-09-05 DIAGNOSIS — F429 Obsessive-compulsive disorder, unspecified: Secondary | ICD-10-CM

## 2020-09-05 DIAGNOSIS — F320 Major depressive disorder, single episode, mild: Secondary | ICD-10-CM

## 2020-09-05 MED ORDER — SERTRALINE HCL 100 MG PO TABS: ORAL_TABLET | ORAL | 0 refills | Status: DC

## 2020-09-06 ENCOUNTER — Ambulatory Visit: Payer: Managed Care, Other (non HMO) | Admitting: Family Medicine

## 2020-09-10 ENCOUNTER — Other Ambulatory Visit: Payer: Self-pay | Admitting: Family Medicine

## 2020-09-10 DIAGNOSIS — F320 Major depressive disorder, single episode, mild: Secondary | ICD-10-CM

## 2020-09-10 DIAGNOSIS — F429 Obsessive-compulsive disorder, unspecified: Secondary | ICD-10-CM

## 2020-09-11 ENCOUNTER — Ambulatory Visit: Payer: Managed Care, Other (non HMO) | Admitting: Family Medicine

## 2020-09-11 ENCOUNTER — Other Ambulatory Visit: Payer: Self-pay

## 2020-09-11 ENCOUNTER — Encounter: Payer: Self-pay | Admitting: Family Medicine

## 2020-09-11 VITALS — BP 108/58 | HR 77 | Ht 64.0 in | Wt 123.0 lb

## 2020-09-11 DIAGNOSIS — R42 Dizziness and giddiness: Secondary | ICD-10-CM | POA: Diagnosis not present

## 2020-09-11 DIAGNOSIS — F320 Major depressive disorder, single episode, mild: Secondary | ICD-10-CM | POA: Diagnosis not present

## 2020-09-11 DIAGNOSIS — F429 Obsessive-compulsive disorder, unspecified: Secondary | ICD-10-CM

## 2020-09-11 MED ORDER — SERTRALINE HCL 25 MG PO TABS: 25.0000 mg | ORAL_TABLET | Freq: Every day | ORAL | 0 refills | Status: DC

## 2020-09-11 MED ORDER — SERTRALINE HCL 100 MG PO TABS: ORAL_TABLET | ORAL | 1 refills | Status: DC

## 2020-09-11 NOTE — Progress Notes (Signed)
Doing well on current regimen.   Pt stated that she did have some episodes of dizziness and this happens whenever she would get really anxious. She wanted to discuss this

## 2020-09-11 NOTE — Assessment & Plan Note (Signed)
Unclear etiology it does not seem to be postural.  That she did feel a little off when she was sitting up during the Dix-Hallpike maneuver which was otherwise negative today.  No changes in diet, heart rate etc.  Increase stress recently.  We did discuss going back up on the sertraline 225 mg for few months and seeing if her symptoms improve if they do not consider further work-up.  In the meantime we will check for anemia and thyroid disorder and electrolyte disturbance.

## 2020-09-11 NOTE — Assessment & Plan Note (Signed)
We discussed going back up on the sertraline 225 mg for a few months and then always going back down to 100 with the idea of hopefully weaning to the least effective dose or even switching as again she is probably going to work on try to become pregnant sometime next year so need to check on the safety data for sertraline for her.

## 2020-09-11 NOTE — Progress Notes (Signed)
Established Patient Office Visit  Subjective:  Patient ID: Katherine Michael, female    DOB: [birth year]  Age: 25 y.o. MRN: [id number]  CC:  Chief Complaint  Patient presents with  . Follow-up    HPI Katherine Michael presents for f/u Depression and OCD.  Overall she has been doing okay on the sertraline.  She was previously taking 125 mcg but then went down to 100 mcg a couple of months ago she was doing really well overall until recently.  She says she has had a few episodes of suddenly feeling like she was going to pass out.  She had a near syncopal event a few years ago and says it feels very similar to that she denies any actual room spinning.  She denies any changes to her diet or feeling dehydrated.  She denies any palpitations or fast heart rate.  It does not happen with exercise.  Denies any triggers such as position change.  She has been under some increased stress recently as she and her husband are moving into their dream home.  She is that her last episode was actually last night.  It can happen while lying down.  The only thing that has changed her make dramatically is that she started running for exercise in November but says she made sure to increase her calories and has been working hard to stay well-hydrated and has not lost any weight since exercising regularly.   [facility]?  She is not sure if her Pap smear is up-to-date but she does go there for care.  She is planning on possibly getting pregnant sometime next year.  Past Medical History:  Diagnosis Date  . Depression, major, single episode, mild (HCC) 12/02/2018  . Obsessive-compulsive disorder 04/15/2007   Overview:  with anxiety-features    No past surgical history on file.  Family History  Problem Relation Age of Onset  . Breast cancer Maternal Grandmother   . Colon cancer Maternal Grandfather   . Breast cancer Paternal Grandmother   . Parkinson's disease Paternal Grandfather     Social History    Socioeconomic History  . Marital status: Married    Spouse name: Not on file  . Number of children: Not on file  . Years of education: some college  . Highest education level: Not on file  Occupational History  . Occupation: cna    Employer: [employer]  Tobacco Use  . Smoking status: Never Smoker  . Smokeless tobacco: Never Used  Substance and Sexual Activity  . Alcohol use: No  . Drug use: No  . Sexual activity: Yes    Partners: Male  Other Topics Concern  . Not on file  Social History Narrative  . Not on file   Social Determinants of Health   Financial Resource Strain: Not on file  Food Insecurity: Not on file  Transportation Needs: Not on file  Physical Activity: Not on file  Stress: Not on file  Social Connections: Not on file  Intimate Partner Violence: Not on file    Outpatient Medications Prior to Visit  Medication Sig Dispense Refill  . levonorgestrel (KYLEENA) 19.5 MG IUD by Intrauterine route once. March 2021    . sertraline (ZOLOFT) 100 MG tablet TAKE 1 TABLET (100 MG) BY MOUTH DAILY 90 tablet 0  . cetirizine (ZYRTEC) 10 MG tablet Take 1 tablet by mouth daily.    . nitrofurantoin, macrocrystal-monohydrate, (MACROBID) 100 MG capsule Take 1 capsule (100 mg total) by mouth 2 (two) times  daily. 20 capsule 0  . ondansetron (ZOFRAN) 4 MG tablet Take 1 tablet (4 mg total) by mouth every 6 (six) hours. 12 tablet 0   No facility-administered medications prior to visit.    Allergies  Allergen Reactions  . Bactrim [Sulfamethoxazole-Trimethoprim] Rash    ROS Review of Systems    Objective:    Physical Exam Constitutional:      Appearance: She is well-developed.  HENT:     Head: Normocephalic and atraumatic.  Cardiovascular:     Rate and Rhythm: Normal rate and regular rhythm.     Heart sounds: Normal heart sounds.  Pulmonary:     Effort: Pulmonary effort is normal.     Breath sounds: Normal breath sounds.  Skin:    General: Skin is warm and  dry.  Neurological:     Mental Status: She is alert and oriented to person, place, and time.     Comments: Negative Dix-Hallpike maneuver.  Psychiatric:        Behavior: Behavior normal.     BP (!) 108/58   Pulse 77   Ht 5\' 4"  (1.626 m)   Wt 123 lb (55.8 kg)   LMP 09/10/2020 (Exact Date)   SpO2 99%   BMI 21.11 kg/m  Wt Readings from Last 3 Encounters:  09/11/20 123 lb (55.8 kg)  01/23/20 124 lb (56.2 kg)  08/27/18 118 lb (53.5 kg)     Health Maintenance Due  Topic Date Due  . HPV VACCINES (1 - 2-dose series) Never done  . CHLAMYDIA SCREENING  Never done  . HIV Screening  Never done  . Hepatitis C Screening  Never done  . PAP-Cervical Cytology Screening  01/09/2020  . PAP SMEAR-Modifier  01/09/2020  . COVID-19 Vaccine (3 - Booster for Pfizer series) 04/30/2020       Topic Date Due  . HPV VACCINES (1 - 2-dose series) Never done    Lab Results  Component Value Date   TSH 0.84 10/26/2017   Lab Results  Component Value Date   WBC 6.0 10/26/2017   HGB 14.4 10/26/2017   HCT 42.4 10/26/2017   MCV 90.2 10/26/2017   PLT 295 10/26/2017   Lab Results  Component Value Date   NA 140 10/26/2017   K 3.9 10/26/2017   CO2 26 10/26/2017   GLUCOSE 104 (H) 10/26/2017   BUN 10 10/26/2017   CREATININE 0.73 10/26/2017   CALCIUM 10.0 10/26/2017   No results found for: CHOL No results found for: HDL No results found for: LDLCALC No results found for: TRIG No results found for: CHOLHDL No results found for: 10/28/2017    Assessment & Plan:   Problem List Items Addressed This Visit      Other   Obsessive-compulsive disorder    Stable on current regimen.      Relevant Medications   sertraline (ZOLOFT) 100 MG tablet   sertraline (ZOLOFT) 25 MG tablet   Episodic lightheadedness    Unclear etiology it does not seem to be postural.  That she did feel a little off when she was sitting up during the Dix-Hallpike maneuver which was otherwise negative today.  No changes in  diet, heart rate etc.  Increase stress recently.  We did discuss going back up on the sertraline 225 mg for few months and seeing if her symptoms improve if they do not consider further work-up.  In the meantime we will check for anemia and thyroid disorder and electrolyte disturbance.  Relevant Orders   CBC   COMPLETE METABOLIC PANEL WITH GFR   TSH   Depression, major, single episode, mild (HCC) - Primary    We discussed going back up on the sertraline 225 mg for a few months and then always going back down to 100 with the idea of hopefully weaning to the least effective dose or even switching as again she is probably going to work on try to become pregnant sometime next year so need to check on the safety data for sertraline for her.      Relevant Medications   sertraline (ZOLOFT) 100 MG tablet   sertraline (ZOLOFT) 25 MG tablet      Encouraged her to call [facility] and get scheduled for her Pap smear.   Meds ordered this encounter  Medications  . sertraline (ZOLOFT) 100 MG tablet    Sig: TAKE 1 TABLET (100 MG) BY MOUTH DAILY    Dispense:  90 tablet    Refill:  1  . sertraline (ZOLOFT) 25 MG tablet    Sig: Take 1 tablet (25 mg total) by mouth daily.    Dispense:  90 tablet    Refill:  0    Follow-up: No follow-ups on file.    Nani Gasser, MD

## 2020-09-11 NOTE — Assessment & Plan Note (Signed)
Stable on current regimen   

## 2020-09-12 LAB — CBC
HCT: 41.4 % (ref 35.0–45.0)
Hemoglobin: 13.6 g/dL (ref 11.7–15.5)
MCH: 30.8 pg (ref 27.0–33.0)
MCHC: 32.9 g/dL (ref 32.0–36.0)
MCV: 93.7 fL (ref 80.0–100.0)
MPV: 9.8 fL (ref 7.5–12.5)
Platelets: 283 10*3/uL (ref 140–400)
RBC: 4.42 10*6/uL (ref 3.80–5.10)
RDW: 11.6 % (ref 11.0–15.0)
WBC: 5.2 10*3/uL (ref 3.8–10.8)

## 2020-09-12 LAB — COMPLETE METABOLIC PANEL WITH GFR
AG Ratio: 1.9 (calc) (ref 1.0–2.5)
ALT: 18 U/L (ref 6–29)
AST: 25 U/L (ref 10–30)
Albumin: 4.9 g/dL (ref 3.6–5.1)
Alkaline phosphatase (APISO): 64 U/L (ref 31–125)
BUN: 9 mg/dL (ref 7–25)
CO2: 29 mmol/L (ref 20–32)
Calcium: 9.7 mg/dL (ref 8.6–10.2)
Chloride: 103 mmol/L (ref 98–110)
Creat: 0.69 mg/dL (ref 0.50–1.10)
GFR, Est African American: 141 mL/min/{1.73_m2} (ref >=60)
GFR, Est Non African American: 122 mL/min/{1.73_m2} (ref >=60)
Globulin: 2.6 g/dL (calc) (ref 1.9–3.7)
Glucose, Bld: 87 mg/dL (ref 65–99)
Potassium: 4.7 mmol/L (ref 3.5–5.3)
Sodium: 139 mmol/L (ref 135–146)
Total Bilirubin: 0.6 mg/dL (ref 0.2–1.2)
Total Protein: 7.5 g/dL (ref 6.1–8.1)

## 2020-09-12 LAB — TSH: TSH: 0.97 mIU/L

## 2020-09-17 ENCOUNTER — Other Ambulatory Visit: Payer: Self-pay | Admitting: Family Medicine

## 2020-09-17 DIAGNOSIS — F320 Major depressive disorder, single episode, mild: Secondary | ICD-10-CM

## 2020-09-17 DIAGNOSIS — F429 Obsessive-compulsive disorder, unspecified: Secondary | ICD-10-CM

## 2020-10-17 ENCOUNTER — Encounter: Payer: Self-pay | Admitting: Family Medicine

## 2020-10-17 NOTE — Telephone Encounter (Signed)
Appointment has been made for tomorrow with Hyman Hopes. AM

## 2020-10-18 ENCOUNTER — Ambulatory Visit: Payer: Managed Care, Other (non HMO) | Admitting: Family Medicine

## 2020-10-18 ENCOUNTER — Encounter: Payer: Self-pay | Admitting: Family Medicine

## 2020-10-18 ENCOUNTER — Other Ambulatory Visit: Payer: Self-pay

## 2020-10-18 VITALS — BP 108/69 | HR 85 | Temp 97.9°F | Resp 16

## 2020-10-18 DIAGNOSIS — L03032 Cellulitis of left toe: Secondary | ICD-10-CM | POA: Diagnosis not present

## 2020-10-18 DIAGNOSIS — L0390 Cellulitis, unspecified: Secondary | ICD-10-CM

## 2020-10-18 MED ORDER — DOXYCYCLINE HYCLATE 100 MG PO TABS: 100.0000 mg | ORAL_TABLET | Freq: Two times a day (BID) | ORAL | 0 refills | Status: AC

## 2020-10-18 NOTE — Progress Notes (Signed)
Acute Office Visit  Subjective:    Patient ID: Katherine Michael, female    DOB: [birth year], 25 y.o.   MRN: [id number]  Chief Complaint  Patient presents with   Toe Pain    HPI Patient is in today for toe infection.  Patient with a left fourth toe irritation that she first noticed Sunday night.  States she is an avid runner and has had athlete's foot in the past but this feels different.  It does not respond to over-the-counter athlete's foot cream.  She reports that she ran about 9 miles on Saturday and by Sunday night her toe started feeling strange with itchy's, tight sensation.  It progressed to become more swollen, red, warm.  Symptoms were initially just on the toe however over the past few days the redness and swelling has moved about halfway up her foot dorsally.  Reports she did a CHG bath last night which she thinks may have helped the redness a little bit.  Pain is about 2 out of 10 soreness, tight sensation.  She has noticed a small area closer to the nail that appears to be forming some pus under the skin.  She has not noticed any drainage.  Denies any fevers, loss of sensation or function, other rashes.  States she left her shoes outside and is not sure if maybe a spider got inside, but she does not recall a specific bite.  No other known causes.     Past Medical History:  Diagnosis Date   Depression, major, single episode, mild (HCC) 12/02/2018   Obsessive-compulsive disorder 04/15/2007   Overview:  with anxiety-features    No past surgical history on file.  Family History  Problem Relation Age of Onset   Breast cancer Maternal Grandmother    Colon cancer Maternal Grandfather    Breast cancer Paternal Grandmother    Parkinson's disease Paternal Grandfather     Social History   Socioeconomic History   Marital status: Married    Spouse name: Not on file   Number of children: Not on file   Years of education: some college   Highest education level: Not on file   Occupational History   Occupation: cna    Employer: [employer]  Tobacco Use   Smoking status: Never   Smokeless tobacco: Never  Substance and Sexual Activity   Alcohol use: No   Drug use: No   Sexual activity: Yes    Partners: Male  Other Topics Concern   Not on file  Social History Narrative   Not on file   Social Determinants of Health   Financial Resource Strain: Not on file  Food Insecurity: Not on file  Transportation Needs: Not on file  Physical Activity: Not on file  Stress: Not on file  Social Connections: Not on file  Intimate Partner Violence: Not on file    Outpatient Medications Prior to Visit  Medication Sig Dispense Refill   levonorgestrel (KYLEENA) 19.5 MG IUD by Intrauterine route once. March 2021     sertraline (ZOLOFT) 100 MG tablet TAKE 1 TABLET (100 MG) BY MOUTH DAILY 90 tablet 1   sertraline (ZOLOFT) 25 MG tablet Take 1 tablet (25 mg total) by mouth daily. 90 tablet 0   No facility-administered medications prior to visit.    Allergies  Allergen Reactions   Bactrim [Sulfamethoxazole-Trimethoprim] Rash    Review of Systems All review of systems negative except what is listed in the HPI     Objective:  Physical Exam Vitals reviewed.  Constitutional:      Appearance: Normal appearance.  Cardiovascular:     Pulses: Normal pulses.  Musculoskeletal:        General: Swelling present.  Skin:    General: Skin is warm and dry.     Findings: Erythema present.     Comments: Left 4th toe with edema, erythema, warmth spreading up dorsal aspect of foot, small area near nail appears to be starting to form a pustule, no fluctuance/induration today. See picture below.  Neurological:     Mental Status: She is alert and oriented to person, place, and time.     Sensory: No sensory deficit.  Psychiatric:        Mood and Affect: Mood normal.        Behavior: Behavior normal.        Thought Content: Thought content normal.        Judgment: Judgment  normal.     Picture does not adequately show erythema and mild edema extending about halfway up foot dorsally  There were no vitals taken for this visit. Wt Readings from Last 3 Encounters:  09/11/20 123 lb (55.8 kg)  01/23/20 124 lb (56.2 kg)  08/27/18 118 lb (53.5 kg)    Health Maintenance Due  Topic Date Due   HPV VACCINES (1 - 2-dose series) Never done   HIV Screening  Never done   Hepatitis C Screening  Never done   PAP-Cervical Cytology Screening  01/09/2020   PAP SMEAR-Modifier  01/09/2020   COVID-19 Vaccine (3 - Booster for Pfizer series) 04/30/2020       Topic Date Due   HPV VACCINES (1 - 2-dose series) Never done     Lab Results  Component Value Date   TSH 0.97 09/11/2020   Lab Results  Component Value Date   WBC 5.2 09/11/2020   HGB 13.6 09/11/2020   HCT 41.4 09/11/2020   MCV 93.7 09/11/2020   PLT 283 09/11/2020   Lab Results  Component Value Date   NA 139 09/11/2020   K 4.7 09/11/2020   CO2 29 09/11/2020   GLUCOSE 87 09/11/2020   BUN 9 09/11/2020   CREATININE 0.69 09/11/2020   BILITOT 0.6 09/11/2020   AST 25 09/11/2020   ALT 18 09/11/2020   PROT 7.5 09/11/2020   CALCIUM 9.7 09/11/2020   No results found for: CHOL No results found for: HDL No results found for: LDLCALC No results found for: TRIG No results found for: CHOLHDL No results found for: [id number]     Assessment & Plan:   1. Cellulitis, unspecified cellulitis site 2. Paronychia of fourth toe, left Given spread of erythema, warmth, and edema, will go ahead and treat as cellulitis with a course of doxycycline (not pregnant). Keep area clean, warm water soaks for 15-20 minutes a few times per day, do not attempt to drain any areas, tylenol/ibuprofen for pain if needed. If not improving after trying this over the weekend, or if symptoms worsen, come back for reevaluation and possible drainage if indicated at that time. Patient aware of signs/symptoms requiring further/urgent  evaluation.   - doxycycline (VIBRA-TABS) 100 MG tablet; Take 1 tablet (100 mg total) by mouth 2 (two) times daily for 7 days.  Dispense: 14 tablet; Refill: 0   Follow-up if not improving over the weekend or if symptoms worsen.    Lollie Marrow Reola Calkins, DNP, FNP-C

## 2020-12-07 ENCOUNTER — Other Ambulatory Visit: Payer: Self-pay

## 2020-12-07 ENCOUNTER — Encounter: Payer: Self-pay | Admitting: Family Medicine

## 2020-12-07 ENCOUNTER — Ambulatory Visit: Payer: Managed Care, Other (non HMO) | Admitting: Family Medicine

## 2020-12-07 VITALS — BP 98/62 | HR 83 | Resp 16

## 2020-12-07 DIAGNOSIS — L0390 Cellulitis, unspecified: Secondary | ICD-10-CM | POA: Diagnosis not present

## 2020-12-07 DIAGNOSIS — B353 Tinea pedis: Secondary | ICD-10-CM

## 2020-12-07 MED ORDER — DOXYCYCLINE HYCLATE 100 MG PO TABS: 100.0000 mg | ORAL_TABLET | Freq: Two times a day (BID) | ORAL | 0 refills | Status: DC

## 2020-12-07 MED ORDER — CLOTRIMAZOLE-BETAMETHASONE 1-0.05 % EX CREA: 1.0000 "application " | TOPICAL_CREAM | Freq: Two times a day (BID) | CUTANEOUS | 0 refills | Status: DC

## 2020-12-07 NOTE — Progress Notes (Signed)
Acute Office Visit  Subjective:    Patient ID: Katherine Michael, female    DOB: 1996-02-26, 25 y.o.   MRN: [id number]  Chief Complaint  Patient presents with   Toe Pain    HPI Patient is in today for toe infection.  Patient was seen in early July for similar issue and treated for cellulitis with doxy. States she finished the antibiotic but did not take it as frequently as prescribed. The toe did look better temporarily until it woke her up about 3 days ago initially very itchy. The area continued to get more irritated, red, swollen, very painful to walk on. Two days ago she noticed a blister on the bottom of her toe and some white areas between the toes. She has started noting some yellow drainage when she takes her socks of. Reports the pain is 5/10 when walking. More of her foot is swollen than last time, and the toe is warm. She denies any fevers, abscesses, bleeding, streaking up her foot, etc.       Past Medical History:  Diagnosis Date   Depression, major, single episode, mild (HCC) 12/02/2018   Obsessive-compulsive disorder 04/15/2007   Overview:  with anxiety-features    History reviewed. No pertinent surgical history.  Family History  Problem Relation Age of Onset   Breast cancer Maternal Grandmother    Colon cancer Maternal Grandfather    Breast cancer Paternal Grandmother    Parkinson's disease Paternal Grandfather     Social History   Socioeconomic History   Marital status: Married    Spouse name: Not on file   Number of children: Not on file   Years of education: some college   Highest education level: Not on file  Occupational History   Occupation: cna    Employer: [employer]  Tobacco Use   Smoking status: Never   Smokeless tobacco: Never  Substance and Sexual Activity   Alcohol use: No   Drug use: No   Sexual activity: Yes    Partners: Male  Other Topics Concern   Not on file  Social History Narrative   Not on file   Social Determinants of  Health   Financial Resource Strain: Not on file  Food Insecurity: Not on file  Transportation Needs: Not on file  Physical Activity: Not on file  Stress: Not on file  Social Connections: Not on file  Intimate Partner Violence: Not on file    Outpatient Medications Prior to Visit  Medication Sig Dispense Refill   levonorgestrel (KYLEENA) 19.5 MG IUD by Intrauterine route once. March 2021     sertraline (ZOLOFT) 100 MG tablet TAKE 1 TABLET (100 MG) BY MOUTH DAILY 90 tablet 1   sertraline (ZOLOFT) 25 MG tablet Take 1 tablet (25 mg total) by mouth daily. 90 tablet 0   No facility-administered medications prior to visit.    Allergies  Allergen Reactions   Bactrim [Sulfamethoxazole-Trimethoprim] Rash    Review of Systems All review of systems negative except what is listed in the HPI      Objective:    Physical Exam Vitals reviewed.  Constitutional:      Appearance: Normal appearance.  Skin:    General: Skin is warm and dry.     Findings: Erythema present.     Comments: L 4th toe with edema, erythema, warmth, blister, flaking skin, and moist area of pale skin between the toes  Neurological:     General: No focal deficit present.  Mental Status: She is alert and oriented to person, place, and time. Mental status is at baseline.  Psychiatric:        Mood and Affect: Mood normal.        Behavior: Behavior normal.        Thought Content: Thought content normal.        Judgment: Judgment normal.          BP 98/62   Pulse 83   Resp 16   SpO2 99%  Wt Readings from Last 3 Encounters:  09/11/20 123 lb (55.8 kg)  01/23/20 124 lb (56.2 kg)  08/27/18 118 lb (53.5 kg)    Health Maintenance Due  Topic Date Due   HPV VACCINES (1 - 2-dose series) Never done   HIV Screening  Never done   Hepatitis C Screening  Never done   COVID-19 Vaccine (3 - Booster for Pfizer series) 04/30/2020   INFLUENZA VACCINE  11/12/2020       Topic Date Due   HPV VACCINES (1 - 2-dose  series) Never done     Lab Results  Component Value Date   TSH 0.97 09/11/2020   Lab Results  Component Value Date   WBC 5.2 09/11/2020   HGB 13.6 09/11/2020   HCT 41.4 09/11/2020   MCV 93.7 09/11/2020   PLT 283 09/11/2020   Lab Results  Component Value Date   NA 139 09/11/2020   K 4.7 09/11/2020   CO2 29 09/11/2020   GLUCOSE 87 09/11/2020   BUN 9 09/11/2020   CREATININE 0.69 09/11/2020   BILITOT 0.6 09/11/2020   AST 25 09/11/2020   ALT 18 09/11/2020   PROT 7.5 09/11/2020   CALCIUM 9.7 09/11/2020   No results found for: CHOL No results found for: HDL No results found for: LDLCALC No results found for: TRIG No results found for: CHOLHDL No results found for: TIRW4R     Assessment & Plan:   1. Cellulitis, unspecified cellulitis site Given that she responded to doxy before will go ahead and treat with another week for cellulitis of L 4th toe. Instructed to keep the area clean and dry. - doxycycline (VIBRA-TABS) 100 MG tablet; Take 1 tablet (100 mg total) by mouth 2 (two) times daily for 7 days.  Dispense: 14 tablet; Refill: 0  2. 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 Encouraged her to let the toes air out as much as possible, keep the area dry, change socks during her shifts if able. Lotrisone cream prescribed.  - clotrimazole-betamethasone (LOTRISONE) cream; Apply 1 application topically 2 (two) times daily.  Dispense: 45 g; Refill: 0  Patient aware of signs/symptoms requiring further/urgent evaluation.  Follow-up as previously scheduled with PCP next week.   Lollie Marrow Reola Calkins, DNP, FNP-C

## 2020-12-07 NOTE — Patient Instructions (Signed)
Keep area between toes as dry as able Try to change socks frequently, at least once during your shift

## 2020-12-10 ENCOUNTER — Other Ambulatory Visit: Payer: Self-pay | Admitting: Family Medicine

## 2020-12-10 DIAGNOSIS — F429 Obsessive-compulsive disorder, unspecified: Secondary | ICD-10-CM

## 2020-12-10 DIAGNOSIS — F320 Major depressive disorder, single episode, mild: Secondary | ICD-10-CM

## 2020-12-11 ENCOUNTER — Other Ambulatory Visit: Payer: Self-pay | Admitting: *Deleted

## 2020-12-13 ENCOUNTER — Encounter: Payer: Self-pay | Admitting: Family Medicine

## 2020-12-13 ENCOUNTER — Other Ambulatory Visit: Payer: Self-pay

## 2020-12-13 ENCOUNTER — Ambulatory Visit: Payer: Managed Care, Other (non HMO) | Admitting: Family Medicine

## 2020-12-13 VITALS — BP 115/61 | HR 81 | Ht 64.0 in | Wt 120.0 lb

## 2020-12-13 DIAGNOSIS — Z3169 Encounter for other general counseling and advice on procreation: Secondary | ICD-10-CM | POA: Diagnosis not present

## 2020-12-13 NOTE — Progress Notes (Signed)
Pt would like to discuss changing Sertraline. She is wanting to start planning on conceiving sometime next year.

## 2020-12-13 NOTE — Progress Notes (Signed)
Established Patient Office Visit  Subjective:  Patient ID: Katherine Michael, female    DOB: Oct 17, 1995  Age: 25 y.o. MRN: [id number]  CC:  Chief Complaint  Patient presents with   Follow-up    HPI Katherine Michael presents for to discuss her sertraline.  She and her husband are going to try to get pregnant sometime next year may be next summer so she is starting to plan ahead and wanted to see if we need to change or adjust her medication she is been on sertraline for quite some time and it is very effective for her it really helps with her mood and particularly with her OCD symptoms.  In fact we had actually increased her dose to 125 mg when I last saw her in May.  She is still on that current dose and is doing really well.  She still reports feeling nervous and anxious more than half the days and feeling like she worries too much overall but is happy with her regimen.  She plans on getting her IUD out soon because she wants a chance to let her hormones reset off of the IUD before trying to get pregnant.  She also completed the doxycycline for an infection on her right foot.  She was seen in our office last week for this.   Past Medical History:  Diagnosis Date   Depression, major, single episode, mild (HCC) 12/02/2018   Obsessive-compulsive disorder 04/15/2007   Overview:  with anxiety-features    No past surgical history on file.  Family History  Problem Relation Age of Onset   Breast cancer Maternal Grandmother    Colon cancer Maternal Grandfather    Breast cancer Paternal Grandmother    Parkinson's disease Paternal Grandfather     Social History   Socioeconomic History   Marital status: Married    Spouse name: Not on file   Number of children: Not on file   Years of education: some college   Highest education level: Not on file  Occupational History   Occupation: cna    Employer: [employer]  Tobacco Use   Smoking status: Never   Smokeless tobacco: Never  Substance and  Sexual Activity   Alcohol use: No   Drug use: No   Sexual activity: Yes    Partners: Male  Other Topics Concern   Not on file  Social History Narrative   Not on file   Social Determinants of Health   Financial Resource Strain: Not on file  Food Insecurity: Not on file  Transportation Needs: Not on file  Physical Activity: Not on file  Stress: Not on file  Social Connections: Not on file  Intimate Partner Violence: Not on file    Outpatient Medications Prior to Visit  Medication Sig Dispense Refill   levonorgestrel (KYLEENA) 19.5 MG IUD by Intrauterine route once. March 2021     sertraline (ZOLOFT) 100 MG tablet TAKE 1 TABLET (100 MG) BY MOUTH DAILY 90 tablet 1   sertraline (ZOLOFT) 25 MG tablet TAKE 1 TABLET(25 MG) BY MOUTH DAILY 90 tablet 0   No facility-administered medications prior to visit.    Allergies  Allergen Reactions   Bactrim [Sulfamethoxazole-Trimethoprim] Rash    ROS Review of Systems    Objective:    Physical Exam Vitals reviewed.  Constitutional:      Appearance: She is well-developed.  HENT:     Head: Normocephalic and atraumatic.  Eyes:     Conjunctiva/sclera: Conjunctivae normal.  Cardiovascular:  Rate and Rhythm: Normal rate.  Pulmonary:     Effort: Pulmonary effort is normal.  Skin:    General: Skin is dry.     Coloration: Skin is not pale.  Neurological:     Mental Status: She is alert and oriented to person, place, and time.  Psychiatric:        Behavior: Behavior normal.    BP 115/61   Pulse 81   Ht 5\' 4"  (1.626 m)   Wt 120 lb (54.4 kg)   SpO2 99%   BMI 20.60 kg/m  Wt Readings from Last 3 Encounters:  12/13/20 120 lb (54.4 kg)  09/11/20 123 lb (55.8 kg)  01/23/20 124 lb (56.2 kg)     Health Maintenance Due  Topic Date Due   HPV VACCINES (1 - 2-dose series) Never done   HIV Screening  Never done   Hepatitis C Screening  Never done   COVID-19 Vaccine (3 - Booster for Pfizer series) 04/30/2020       Topic  Date Due   HPV VACCINES (1 - 2-dose series) Never done    Lab Results  Component Value Date   TSH 0.97 09/11/2020   Lab Results  Component Value Date   WBC 5.2 09/11/2020   HGB 13.6 09/11/2020   HCT 41.4 09/11/2020   MCV 93.7 09/11/2020   PLT 283 09/11/2020   Lab Results  Component Value Date   NA 139 09/11/2020   K 4.7 09/11/2020   CO2 29 09/11/2020   GLUCOSE 87 09/11/2020   BUN 9 09/11/2020   CREATININE 0.69 09/11/2020   BILITOT 0.6 09/11/2020   AST 25 09/11/2020   ALT 18 09/11/2020   PROT 7.5 09/11/2020   CALCIUM 9.7 09/11/2020   No results found for: CHOL No results found for: HDL No results found for: LDLCALC No results found for: TRIG No results found for: CHOLHDL No results found for: 09/13/2020    Assessment & Plan:   Problem List Items Addressed This Visit   None Visit Diagnoses     Encounter for preconception consultation    -  Primary      Encounter for preconception counseling-we discussed the sertraline.  I think this is one of the better SSRIs to be on during pregnancy we discussed pros and cons and possibly even just tapering back on her dose.  He is worried that if she comes completely off of the medication that it will negatively impact her mood.  She is planning on getting her IUD out in the next few months.  So we also discussed starting a prenatal vitamin.  No orders of the defined types were placed in this encounter.   Follow-up: No follow-ups on file.   I spent 22 minutes on the day of the encounter to include pre-visit record review, face-to-face time with the patient and post visit ordering of test.   [id number], MD

## 2020-12-18 ENCOUNTER — Encounter: Payer: Self-pay | Admitting: Family Medicine

## 2020-12-19 ENCOUNTER — Ambulatory Visit (INDEPENDENT_AMBULATORY_CARE_PROVIDER_SITE_OTHER): Payer: 59 | Admitting: Professional

## 2020-12-19 DIAGNOSIS — F411 Generalized anxiety disorder: Secondary | ICD-10-CM

## 2020-12-21 ENCOUNTER — Other Ambulatory Visit: Payer: Self-pay | Admitting: Family Medicine

## 2020-12-21 DIAGNOSIS — F429 Obsessive-compulsive disorder, unspecified: Secondary | ICD-10-CM

## 2020-12-21 DIAGNOSIS — F320 Major depressive disorder, single episode, mild: Secondary | ICD-10-CM

## 2021-01-01 ENCOUNTER — Ambulatory Visit (INDEPENDENT_AMBULATORY_CARE_PROVIDER_SITE_OTHER): Payer: 59 | Admitting: Professional

## 2021-01-01 DIAGNOSIS — F411 Generalized anxiety disorder: Secondary | ICD-10-CM | POA: Diagnosis not present

## 2021-01-25 ENCOUNTER — Ambulatory Visit (INDEPENDENT_AMBULATORY_CARE_PROVIDER_SITE_OTHER): Payer: 59 | Admitting: Professional

## 2021-01-25 DIAGNOSIS — F411 Generalized anxiety disorder: Secondary | ICD-10-CM | POA: Diagnosis not present

## 2021-02-05 ENCOUNTER — Ambulatory Visit: Payer: 59 | Admitting: Professional

## 2021-02-18 ENCOUNTER — Ambulatory Visit: Payer: 59 | Admitting: Professional

## 2021-02-21 ENCOUNTER — Ambulatory Visit (INDEPENDENT_AMBULATORY_CARE_PROVIDER_SITE_OTHER): Payer: 59 | Admitting: Professional

## 2021-02-21 DIAGNOSIS — F411 Generalized anxiety disorder: Secondary | ICD-10-CM

## 2021-02-25 ENCOUNTER — Ambulatory Visit: Payer: 59 | Admitting: Professional

## 2021-03-10 ENCOUNTER — Other Ambulatory Visit: Payer: Self-pay | Admitting: Family Medicine

## 2021-03-10 DIAGNOSIS — F429 Obsessive-compulsive disorder, unspecified: Secondary | ICD-10-CM

## 2021-03-10 DIAGNOSIS — F320 Major depressive disorder, single episode, mild: Secondary | ICD-10-CM

## 2021-03-11 ENCOUNTER — Ambulatory Visit (INDEPENDENT_AMBULATORY_CARE_PROVIDER_SITE_OTHER): Payer: 59 | Admitting: Professional

## 2021-03-11 DIAGNOSIS — F411 Generalized anxiety disorder: Secondary | ICD-10-CM

## 2021-03-29 ENCOUNTER — Encounter: Payer: Self-pay | Admitting: Professional

## 2021-03-29 ENCOUNTER — Encounter: Payer: 59 | Admitting: Professional

## 2021-03-29 NOTE — Progress Notes (Signed)
This encounter was created in error - please disregard.

## 2021-04-26 ENCOUNTER — Encounter: Payer: Self-pay | Admitting: Professional

## 2021-04-26 ENCOUNTER — Ambulatory Visit (INDEPENDENT_AMBULATORY_CARE_PROVIDER_SITE_OTHER): Payer: 59 | Admitting: Professional

## 2021-04-26 DIAGNOSIS — F411 Generalized anxiety disorder: Secondary | ICD-10-CM

## 2021-04-26 DIAGNOSIS — F320 Major depressive disorder, single episode, mild: Secondary | ICD-10-CM | POA: Diagnosis not present

## 2021-04-26 NOTE — Progress Notes (Deleted)
° ° ° ° ° ° ° ° ° ° ° ° ° ° °  Osvaldo Lamping, LCMHC °

## 2021-04-26 NOTE — Progress Notes (Signed)
Behavioral Health Counselor/Therapist Progress Note  Patient ID: Katherine Michael, MRN: [id number],    Date: 04/26/2021  Time Spent: 48 minutes 1000-1048 am  Treatment Type: Individual Therapy  Reported Symptoms: irritable, angry, sad, questioning  Mental Status Exam: Appearance:  Dressed in robe      Behavior: Appropriate and Sharing  Motor: Normal  Speech/Language:  Clear and Coherent and Normal Rate  Affect: Full Range  Mood: anxious, irritable, and sad  Thought process: goal directed  Thought content:   WNL  Sensory/Perceptual disturbances:   WNL  Orientation: oriented to person, place, time/date, and situation  Attention: Good  Concentration: Good  Memory: WNL  Fund of knowledge:  Good  Insight:   Good  Judgment:  Good  Impulse Control: Good   Risk Assessment: Danger to Self:  No Self-injurious Behavior: No Danger to Others: No Duty to Warn:no Physical Aggression / Violence:No  Access to Firearms a concern: No  Gang Involvement:No   Subjective:  This session was held via video teletherapy due to the coronavirus risk at this time. The patient consented to video teletherapy and was located at her home during this session. She is aware it is the responsibility of the patient to secure confidentiality on her end of the session. The provider was in a private home office for the duration of this session.   Issues addressed: 1-in-law issues -patient's sister-in-law (SIL) told her mother(MIL) that the patient was on antidepressants and had her IUD removed   -they took it upon there own to talk to patient's spouse Katherine Michael and she felt targeted and singled out   -she is hurt and angry -pt already worrying about how she will manage once she has children if this is how she responds to the possibility of children   -discussed how to set healthy boundaries   -discussed importance of communicating her thoughts and feelings to husband   -suggested pt and spouse may want to  decide how they plan to manage -patient shared additional information related to her response after SIL and MIL discussed with Katherine Michael   -followed up with email   -got response from both SIL and MIL     -MIL commented that they are a very close family and communicate openly   -discussed definition of family vs. family of origin and opportunities to decide how to navigate -patient feels disrespected and angry   -asked pt to consider other feelings she might have     -she could not identify     -clinician suggested disappointed and pt agreed -how to manage moving forward   -relationship with MIL very different than relationship with SIL   -how to address MIL and disappointed feelings she experienced   -how to clean out all the wrongs her SIL has done since pt has been in family     -SIL likely feels a loss (of relationship with brother)     -SIL may see pt in competitive nature (she has always been only girl in family)   -SIL does appear to want the attention and is manipulative  Problems Addressed  Anxiety, Family Conflict, Low Self-Esteem, Obsessive-Compulsive Disorder (OCD)  Goals 1. Accept the presence of obsessive thoughts without acting on them and commit to a value-driven life. Objective Gain insight into how childhood experiences might influence current struggles with OCD and take appropriate actions. Target Date: 2021-12-18 Frequency: Biweekly  Progress: 0 Modality: individual  Objective Engage in a strategic ordeal to overcome OCD impulses. Target Date: 2021-12-18  Frequency: Biweekly  Progress: 0 Modality: individual  Objective Identify and discuss unresolved life conflicts. Target Date: 2021-12-18 Frequency: Biweekly  Progress: 0 Modality: individual  Related Interventions Explore the client's life circumstances to help identify key unresolved conflicts that may underlie OCD. Objective Keep a daily journal of obsessions, compulsions, and triggers; record thoughts,  feelings, and actions taken. Target Date: 2021-12-18 Frequency: Biweekly  Progress: 0 Modality: individual  Related Interventions Ask the client to self-monitor obsessions, compulsions, and triggers; record thoughts, feelings, and actions taken; routinely process the data to facilitate the accomplishment of therapeutic objectives (or assign "Analyze the Probability of a Feared Event" in the Adult Psychotherapy Homework Planner by Stephannie LiJongsma). 2. Achieve a reasonable level of family connectedness and harmony where members support, help, and are concerned for each other. 3. Decrease the level of present conflict with parents while beginning to let go of or resolving past conflicts with them. 4. Demonstrate improved self-esteem through more pride in appearance, more assertiveness, greater eye contact, and identification of positive traits in self-talk messages. 5. Develop a consistent, positive self-image. 6. Elevate self-esteem. 7. Enhance ability to effectively cope with the full variety of life's worries and anxieties. 8. Establish an inward sense of self-worth, confidence, and competence. 9. Function daily at a consistent level with minimal interference from obsessions and compulsions. 10. Learn and implement coping skills that result in a reduction of anxiety and worry, and improved daily functioning. Objective Learn and implement problem-solving strategies for realistically addressing worries. Target Date: 2021-12-18 Frequency: Biweekly  Progress: 0 Modality: individual  Related Interventions Teach the client problem-solving strategies involving specifically defining a problem, generating options for addressing it, evaluating the pros and cons of each option, selecting and implementing an optional action, and reevaluating and refining the action (or assign "Applying Problem-Solving to Interpersonal Conflict" in the Adult Psychotherapy Homework Planner by Stephannie LiJongsma). Objective Identify the major  life conflicts from the past and present that form the basis for present anxiety. Target Date: 2021-12-18 Frequency: Biweekly  Progress: 0 Modality: individual  Related Interventions Ask the client to develop and process a list of key past and present life conflicts that continue to cause worry. Assist the client in becoming aware of key unresolved life conflicts and in starting to work toward their resolution. Objective Learn to accept limitations in life and commit to tolerating, rather than avoiding, unpleasant emotions while accomplishing meaningful goals. Target Date: 2021-12-18 Frequency: Biweekly  Progress: 0 Modality: individual  Related Interventions Use techniques from Acceptance and Commitment Therapy to help client accept uncomfortable realities such as lack of complete control, imperfections, and uncertainty and tolerate unpleasant emotions and thoughts in order to accomplish value-consistent goals. Objective Identify and engage in pleasant activities on a daily basis. Target Date: 2021-12-18 Frequency: Biweekly  Progress: 0 Modality: individual  Related Interventions Engage the client in behavioral activation, increasing the client's contact with sources of reward, identifying processes that inhibit activation, and teaching skills to solve life problems (or assign "Identify and Schedule Pleasant Activities" in the Adult Psychotherapy Homework Planner by Jongsma); use behavioral techniques such as instruction, rehearsal, role-playing, role reversal as needed to assist adoption into the client's daily life; reinforce success. Objective Identify, challenge, and replace biased, fearful self-talk with positive, realistic, and empowering self-talk. Target Date: 2021-12-18 Frequency: Biweekly  Progress: 0 Modality: individual  Related Interventions Assign the client a homework exercise in which he/she identifies fearful self-talk, identifies biases in the self-talk, generates  alternatives, and tests through behavioral experiments (or assign "Negative  Thoughts Trigger Negative Feelings" in the Adult Psychotherapy Homework Planner by [facility]ma); review and reinforce success, providing corrective feedback toward improvement. Explore the client's schema and self-talk that mediate his/her fear response; assist him/her in challenging the biases; replace the distorted messages with reality-based alternatives and positive, realistic self-talk that will increase his/her self-confidence in coping with irrational fears (see Cognitive Therapy of Anxiety Disorders by Laurence Slatelark and Beck). Objective Learn and implement a strategy to limit the association between various environmental settings and worry, delaying the worry until a designated "worry time." Target Date: 2021-12-18 Frequency: Biweekly  Progress: 0 Modality: individual  Related Interventions Teach the client how to recognize, stop, and postpone worry to the agreed upon worry time using skills such as thought stopping, relaxation, and redirecting attention (or assign "Making Use of the Thought-Stopping Technique" and/or "Worry Time" in the Adult Psychotherapy Homework Planner by Jongsma to assist skill development); encourage use in daily life; review and reinforce success while providing corrective feedback toward improvement. Explain the rationale for using a worry time as well as how it is to be used; agree upon and implement a worry time with the client. Objective Learn and implement calming skills to reduce overall anxiety and manage anxiety symptoms. Target Date: 2021-12-18 Frequency: Biweekly  Progress: 0 Modality: individual  Related Interventions Teach the client calming/relaxation skills (e.g., applied relaxation, progressive muscle relaxation, cue-controlled relaxation; mindful breathing; biofeedback) and how to discriminate better between relaxation and tension; teach the client how to apply these skills to his/her daily  life (e.g., New Directions in Progressive Muscle Relaxation by Marcelyn DittyBernstein, Borkovec, and Hazlett-Stevens; Treating Generalized Anxiety Disorder by Rygh and Ida RogueSanderson). 11. Let go of key thoughts, beliefs, and past life events in order to maximize time free from obsessions and compulsions. 12. Reach a level of reduced tension, increased satisfaction, and improved communication with family and/or other authority figures. Objective Increase the number of positive family interactions by planning activities. Target Date: 2021-12-18 Frequency: Biweekly  Progress: 0 Modality: individual  Related Interventions Assist the client in developing a list of positive family activities that promote harmony (e.g., bowling, fishing, playing table games, doing work projects). Schedule such activities into the family calendar. Objective Family members demonstrate increased openness by sharing thoughts and feelings about family dynamics, roles, and expectations. Target Date: 2021-12-18 Frequency: Biweekly  Progress: 0 Modality: individual  Objective Report an increase in resolving conflicts with parents by talking calmly and assertively rather than aggressively and defensively. Target Date: 2021-12-18 Frequency: Biweekly  Progress: 0 Modality: individual  Related Interventions Use role-playing, role reversal, modeling, and behavioral rehearsal to help the client develop assertive ways to resolve conflict with parents (recommend Your Perfect Right: Assertiveness and Equality in Your Life and Relationships by Carnella GuadalajaraAlberti and Emmons). 13. Reduce overall frequency, intensity, and duration of the anxiety so that daily functioning is not impaired. 14. Resolve key life conflicts and the emotional stress that fuels obsessive-compulsive behavior patterns. Objective Accept or work to resolve identified life conflicts. Target Date: 2021-12-18 Frequency: Biweekly  Progress: 0 Modality: individual  Related Interventions Explore  the resolution of identified interpersonal or other identified life conflicts; assist the client with acceptance of those that cannot be changed or use a conflict-resolution approach to address those that can. Objective Verbalize an accurate understanding of OCD, how it develops, and how it is maintained. Target Date: 2021-12-18 Frequency: Biweekly  Progress: 0 Modality: individual  Related Interventions Convey a biopsychosocial model for the development and maintenance of OCD highlighting the role of  unwarranted fear and avoidance in its maintenance (see Mastery of Obsessive-Compulsive Disorder by Phebe Colla). 15. Stabilize anxiety level while increasing ability to function on a daily basis  Diagnosis:Generalized anxiety disorder  Depression, major, single episode, mild (HCC)  Plan:  -meet again on Friday, May 10, 2021 at 10am.  Teofilo Pod, [facility]

## 2021-05-10 ENCOUNTER — Ambulatory Visit: Payer: Managed Care, Other (non HMO) | Admitting: Sports Medicine

## 2021-05-10 ENCOUNTER — Other Ambulatory Visit: Payer: Self-pay

## 2021-05-10 ENCOUNTER — Ambulatory Visit (INDEPENDENT_AMBULATORY_CARE_PROVIDER_SITE_OTHER): Payer: 59 | Admitting: Professional

## 2021-05-10 ENCOUNTER — Encounter: Payer: Self-pay | Admitting: Professional

## 2021-05-10 ENCOUNTER — Ambulatory Visit (INDEPENDENT_AMBULATORY_CARE_PROVIDER_SITE_OTHER): Payer: Managed Care, Other (non HMO)

## 2021-05-10 DIAGNOSIS — Z09 Encounter for follow-up examination after completed treatment for conditions other than malignant neoplasm: Secondary | ICD-10-CM

## 2021-05-10 DIAGNOSIS — M25561 Pain in right knee: Secondary | ICD-10-CM

## 2021-05-10 DIAGNOSIS — F411 Generalized anxiety disorder: Secondary | ICD-10-CM | POA: Diagnosis not present

## 2021-05-10 DIAGNOSIS — F320 Major depressive disorder, single episode, mild: Secondary | ICD-10-CM

## 2021-05-10 NOTE — Progress Notes (Signed)
[facility] Counselor/Therapist Progress Note  Patient ID: Katherine Michael, MRN: [id number],    Date: 05/10/2021  Time Spent: 39 minutes [id number] am  Treatment Type: Individual Therapy  Reported Symptoms: discouraged  Mental Status Exam: Appearance:  Casual      Behavior: Appropriate and Sharing  Motor: Normal  Speech/Language:  Clear and Coherent and Normal Rate  Affect: Full Range  Mood: anxious, irritable, and sad  Thought process: goal directed  Thought content:   WNL  Sensory/Perceptual disturbances:   WNL  Orientation: oriented to person, place, time/date, and situation  Attention: Good  Concentration: Good  Memory: WNL  Fund of knowledge:  Good  Insight:   Good  Judgment:  Good  Impulse Control: Good   Risk Assessment: Danger to Self:  No Self-injurious Behavior: No Danger to Others: No Duty to Warn:no Physical Aggression / Violence:No  Access to Firearms a concern: No  Gang Involvement:No   Subjective:  This session was held via video teletherapy due to the coronavirus risk at this time. The patient consented to video teletherapy and was located at her home during this session. She is aware it is the responsibility of the patient to secure confidentiality on her end of the session. The provider was in a private home office for the duration of this session.   Issues addressed: 1-medical -pt injured knee and cannot run -she will be seeing an orthopedic -had to cancel marathon at end of Feb 2023 2-in laws -doing better with sister in law situation -church service was really helper -she talked to her mother in law   -excuses were made for her sister in law's behaviors   -pt decided not to exhausts herself over how she handles -as issues arise she will address 3-coping -getting involved in church -strength training -discussed other opportunities   -spin classes   -lap swimming -pt worry about "future me vs current me" -she fears talking  herself out of the "what ifs" -planning for future   -learning from others (neighbor friend Dahlia ClientHannah mother of 3514 mo old)  Problems Addressed  Anxiety, Family Conflict, Low Self-Esteem, Obsessive-Compulsive Disorder (OCD)  Goals 1. Accept the presence of obsessive thoughts without acting on them and commit to a value-driven life. Objective Gain insight into how childhood experiences might influence current struggles with OCD and take appropriate actions. Target Date: 2021-12-18 Frequency: Biweekly  Progress: 0 Modality: individual  Objective Engage in a strategic ordeal to overcome OCD impulses. Target Date: 2021-12-18 Frequency: Biweekly  Progress: 0 Modality: individual  Objective Identify and discuss unresolved life conflicts. Target Date: 2021-12-18 Frequency: Biweekly  Progress: 0 Modality: individual  Related Interventions Explore the client's life circumstances to help identify key unresolved conflicts that may underlie OCD. Objective Keep a daily journal of obsessions, compulsions, and triggers; record thoughts, feelings, and actions taken. Target Date: 2021-12-18 Frequency: Biweekly  Progress: 0 Modality: individual  Related Interventions Ask the client to self-monitor obsessions, compulsions, and triggers; record thoughts, feelings, and actions taken; routinely process the data to facilitate the accomplishment of therapeutic objectives (or assign "Analyze the Probability of a Feared Event" in the Adult Psychotherapy Homework Planner by Stephannie LiJongsma). 2. Achieve a reasonable level of family connectedness and harmony where members support, help, and are concerned for each other. 3. Decrease the level of present conflict with parents while beginning to let go of or resolving past conflicts with them. 4. Demonstrate improved self-esteem through more pride in appearance, more assertiveness, greater eye contact, and identification of positive  traits in self-talk messages. 5. Develop a  consistent, positive self-image. 6. Elevate self-esteem. 7. Enhance ability to effectively cope with the full variety of life's worries and anxieties. 8. Establish an inward sense of self-worth, confidence, and competence. 9. Function daily at a consistent level with minimal interference from obsessions and compulsions. 10. Learn and implement coping skills that result in a reduction of anxiety and worry, and improved daily functioning. Objective Learn and implement problem-solving strategies for realistically addressing worries. Target Date: 2021-12-18 Frequency: Biweekly  Progress: 0 Modality: individual  Related Interventions Teach the client problem-solving strategies involving specifically defining a problem, generating options for addressing it, evaluating the pros and cons of each option, selecting and implementing an optional action, and reevaluating and refining the action (or assign "Applying Problem-Solving to Interpersonal Conflict" in the Adult Psychotherapy Homework Planner by [name]). Objective Identify the major life conflicts from the past and present that form the basis for present anxiety. Target Date: 2021-12-18 Frequency: Biweekly  Progress: 0 Modality: individual  Related Interventions Ask the client to develop and process a list of key past and present life conflicts that continue to cause worry. Assist the client in becoming aware of key unresolved life conflicts and in starting to work toward their resolution. Objective Learn to accept limitations in life and commit to tolerating, rather than avoiding, unpleasant emotions while accomplishing meaningful goals. Target Date: 2021-12-18 Frequency: Biweekly  Progress: 0 Modality: individual  Related Interventions Use techniques from Acceptance and Commitment Therapy to help client accept uncomfortable realities such as lack of complete control, imperfections, and uncertainty and tolerate unpleasant emotions and thoughts  in order to accomplish value-consistent goals. Objective Identify and engage in pleasant activities on a daily basis. Target Date: 2021-12-18 Frequency: Biweekly  Progress: 0 Modality: individual  Related Interventions Engage the client in behavioral activation, increasing the client's contact with sources of reward, identifying processes that inhibit activation, and teaching skills to solve life problems (or assign "Identify and Schedule Pleasant Activities" in the Adult Psychotherapy Homework Planner by Jongsma); use behavioral techniques such as instruction, rehearsal, role-playing, role reversal as needed to assist adoption into the client's daily life; reinforce success. Objective Identify, challenge, and replace biased, fearful self-talk with positive, realistic, and empowering self-talk. Target Date: 2021-12-18 Frequency: Biweekly  Progress: 0 Modality: individual  Related Interventions Assign the client a homework exercise in which he/she identifies fearful self-talk, identifies biases in the self-talk, generates alternatives, and tests through behavioral experiments (or assign "Negative Thoughts Trigger Negative Feelings" in the Adult Psychotherapy Homework Planner by [employer]); review and reinforce success, providing corrective feedback toward improvement. Explore the client's schema and self-talk that mediate his/her fear response; assist him/her in challenging the biases; replace the distorted messages with reality-based alternatives and positive, realistic self-talk that will increase his/her self-confidence in coping with irrational fears (see Cognitive Therapy of Anxiety Disorders by Laurence Slate). Objective Learn and implement a strategy to limit the association between various environmental settings and worry, delaying the worry until a designated "worry time." Target Date: 2021-12-18 Frequency: Biweekly  Progress: 0 Modality: individual  Related Interventions Teach the client  how to recognize, stop, and postpone worry to the agreed upon worry time using skills such as thought stopping, relaxation, and redirecting attention (or assign "Making Use of the Thought-Stopping Technique" and/or "Worry Time" in the Adult Psychotherapy Homework Planner by Jongsma to assist skill development); encourage use in daily life; review and reinforce success while providing corrective feedback toward improvement. Explain the rationale for  using a worry time as well as how it is to be used; agree upon and implement a worry time with the client. Objective Learn and implement calming skills to reduce overall anxiety and manage anxiety symptoms. Target Date: 2021-12-18 Frequency: Biweekly  Progress: 0 Modality: individual  Related Interventions Teach the client calming/relaxation skills (e.g., applied relaxation, progressive muscle relaxation, cue-controlled relaxation; mindful breathing; biofeedback) and how to discriminate better between relaxation and tension; teach the client how to apply these skills to his/her daily life (e.g., New Directions in Progressive Muscle Relaxation by Marcelyn Ditty, and Hazlett-Stevens; Treating Generalized Anxiety Disorder by Rygh and Ida Rogue). 11. Let go of key thoughts, beliefs, and past life events in order to maximize time free from obsessions and compulsions. 12. Reach a level of reduced tension, increased satisfaction, and improved communication with family and/or other authority figures. Objective Increase the number of positive family interactions by planning activities. Target Date: 2021-12-18 Frequency: Biweekly  Progress: 0 Modality: individual  Related Interventions Assist the client in developing a list of positive family activities that promote harmony (e.g., bowling, fishing, playing table games, doing work projects). Schedule such activities into the family calendar. Objective Family members demonstrate increased openness by sharing  thoughts and feelings about family dynamics, roles, and expectations. Target Date: 2021-12-18 Frequency: Biweekly  Progress: 0 Modality: individual  Objective Report an increase in resolving conflicts with parents by talking calmly and assertively rather than aggressively and defensively. Target Date: 2021-12-18 Frequency: Biweekly  Progress: 0 Modality: individual  Related Interventions Use role-playing, role reversal, modeling, and behavioral rehearsal to help the client develop assertive ways to resolve conflict with parents (recommend Your Perfect Right: Assertiveness and Equality in Your Life and Relationships by Carnella Guadalajara). 13. Reduce overall frequency, intensity, and duration of the anxiety so that daily functioning is not impaired. 14. Resolve key life conflicts and the emotional stress that fuels obsessive-compulsive behavior patterns. Objective Accept or work to resolve identified life conflicts. Target Date: 2021-12-18 Frequency: Biweekly  Progress: 0 Modality: individual  Related Interventions Explore the resolution of identified interpersonal or other identified life conflicts; assist the client with acceptance of those that cannot be changed or use a conflict-resolution approach to address those that can. Objective Verbalize an accurate understanding of OCD, how it develops, and how it is maintained. Target Date: 2021-12-18 Frequency: Biweekly  Progress: 0 Modality: individual  Related Interventions Convey a biopsychosocial model for the development and maintenance of OCD highlighting the role of unwarranted fear and avoidance in its maintenance (see Mastery of Obsessive-Compulsive Disorder by Phebe Colla). 15. Stabilize anxiety level while increasing ability to function on a daily basis  Diagnosis:Generalized anxiety disorder  Depression, major, single episode, mild (HCC)  Plan:  -use alternative coping strategies since she is unable to run at this time. -meet  again on Tuesday, May 21, 2021 at 11am.  Teofilo Pod, [facility]                  [place], [facility]

## 2021-05-10 NOTE — Patient Instructions (Signed)
Hip Rehabilitation Protocol:  1.  Side leg raises.  3x30 with no weight, then 3x15 with 2 lb ankle weight, then 3x15 with 5 lb ankle weight 2.  Standing hip rotation.  3x30 with no weight, then 3x15 with 2 lb ankle weight, then 3x15 with 5 lb ankle weight. 3.  Side step ups.  3x30 with no weight, then 3x15 with 5 lbs in backpack, then 3x15 with 10 lbs in backpack. 

## 2021-05-10 NOTE — Assessment & Plan Note (Signed)
This is a pleasant 26 year old female, she has had about 2 weeks of right knee pain, posterior lateral localized at the biceps femoris. She is a marathon runner, she was doing about 30 miles per week prior to developing pain. No change in running surface, footwear, no major changes in distance. On exam she has tenderness at the biceps femoris, other structures are stable, no pain at the patellar facets, joint line, good motion, good strength with the exception of hip abductor's, weak hip abductor's on the right. There is a very slight leg length discrepancy left longer than right by maybe 1 cm. We did discuss hip abductor conditioning, I would like some x-rays, she will get a knee sleeve, she will cross train for the next week or 2 before restarting at about 15 miles per week. Return to see me in 6 weeks, MR if no better.

## 2021-05-10 NOTE — Progress Notes (Signed)
° ° °  Procedures performed today:    None.  Independent interpretation of notes and tests performed by another provider:   None.  Brief History, Exam, Impression, and Recommendations:    Right knee pain This is a pleasant 26 year old female, she has had about 2 weeks of right knee pain, posterior lateral localized at the biceps femoris. She is a marathon runner, she was doing about 30 miles per week prior to developing pain. No change in running surface, footwear, no major changes in distance. On exam she has tenderness at the biceps femoris, other structures are stable, no pain at the patellar facets, joint line, good motion, good strength with the exception of hip abductor's, weak hip abductor's on the right. There is a very slight leg length discrepancy left longer than right by maybe 1 cm. We did discuss hip abductor conditioning, I would like some x-rays, she will get a knee sleeve, she will cross train for the next week or 2 before restarting at about 15 miles per week. Return to see me in 6 weeks, MR if no better.  I spent 30 minutes of total time managing this patient today, this includes chart review, face to face, and non-face to face time.  ___________________________________________ Ihor Austin. Benjamin Stain, M.D., ABFM., CAQSM. Primary Care and Sports Medicine [facility]  Adjunct Instructor of Family Medicine  [employer]

## 2021-05-21 ENCOUNTER — Encounter: Payer: Self-pay | Admitting: Professional

## 2021-05-21 ENCOUNTER — Ambulatory Visit (INDEPENDENT_AMBULATORY_CARE_PROVIDER_SITE_OTHER): Payer: 59 | Admitting: Professional

## 2021-05-21 DIAGNOSIS — F320 Major depressive disorder, single episode, mild: Secondary | ICD-10-CM | POA: Diagnosis not present

## 2021-05-21 DIAGNOSIS — F411 Generalized anxiety disorder: Secondary | ICD-10-CM | POA: Diagnosis not present

## 2021-05-21 NOTE — Progress Notes (Signed)
[facility] Counselor/Therapist Progress Note  Patient ID: Katherine Michael, MRN: [id number],    Date: 05/21/2021  Time Spent: 40 minutes [id number] am  Treatment Type: Individual Therapy  Reported Symptoms: grieving loss of cousin  Mental Status Exam: Appearance:  Casual      Behavior: Appropriate and Sharing  Motor: Normal  Speech/Language:  Clear and Coherent and Normal Rate  Affect: Full Range  Mood: anxious, irritable, and sad  Thought process: goal directed  Thought content:   WNL  Sensory/Perceptual disturbances:   WNL  Orientation: oriented to person, place, time/date, and situation  Attention: Good  Concentration: Good  Memory: WNL  Fund of knowledge:  Good  Insight:   Good  Judgment:  Good  Impulse Control: Good   Risk Assessment: Danger to Self:  No Self-injurious Behavior: No Danger to Others: No Duty to Warn:no Physical Aggression / Violence:No  Access to Firearms a concern: No  Gang Involvement:No   Subjective:  This session was held via video teletherapy due to the coronavirus risk at this time. The patient consented to video teletherapy and was located at her home during this session. She is aware it is the responsibility of the patient to secure confidentiality on her end of the session. The provider was in a private home office for the duration of this session.   Issues addressed: 1-medical -pt injured knee and cannot run -she will be seeing an orthopedic -had to cancel marathon at end of Feb 2023 2-death in family -lost 26 year old cousin to an overdose -funeral will be this coming Saturday -has been drawn out -it was unexpected "but was not if but when" -he had made several attempts to get well   -he was slated to go into treatment the day after the overdose   -his mother wonders if he was using one last time before treatment -he was raised with the patient throughout childhood   -he started drifting away at 15 and then the  addiction took over -bring up loss of another cousin years before from a house fire   -his brother is really struggling with this loss -will be at her [employer]   -hope for it to be more of an outreach or cousin's 3-professional -job is going good -she and husband both see a difference in her overall anxiety   -she is not having to deal with actively dying people   -she likes that she is still helping but they are not often dying   -did not feel valued by management in other job     -it was about quotas     -looking a certain way that doesn't line up with patient care -pt has learned how not to be -pt admits that job satisfaction is at the top of her level of importance   -pt realizes that there are issues in every department 4-in-laws -sister had to be negative about something   -recently related to grandfather's   Problems Addressed  Anxiety, Family Conflict, Low Self-Esteem, Obsessive-Compulsive Disorder (OCD)  Goals 1. Accept the presence of obsessive thoughts without acting on them and commit to a value-driven life. Objective Gain insight into how childhood experiences might influence current struggles with OCD and take appropriate actions. Target Date: 2021-12-18 Frequency: Biweekly  Progress: 0 Modality: individual  Objective Engage in a strategic ordeal to overcome OCD impulses. Target Date: 2021-12-18 Frequency: Biweekly  Progress: 0 Modality: individual  Objective Identify and discuss unresolved life conflicts. Target Date: 2021-12-18  Frequency: Biweekly  Progress: 0 Modality: individual  Related Interventions Explore the client's life circumstances to help identify key unresolved conflicts that may underlie OCD. Objective Keep a daily journal of obsessions, compulsions, and triggers; record thoughts, feelings, and actions taken. Target Date: 2021-12-18 Frequency: Biweekly  Progress: 0 Modality: individual  Related Interventions Ask the client to  self-monitor obsessions, compulsions, and triggers; record thoughts, feelings, and actions taken; routinely process the data to facilitate the accomplishment of therapeutic objectives (or assign "Analyze the Probability of a Feared Event" in the Adult Psychotherapy Homework Planner by [name]). 2. Achieve a reasonable level of family connectedness and harmony where members support, help, and are concerned for each other. 3. Decrease the level of present conflict with parents while beginning to let go of or resolving past conflicts with them. 4. Demonstrate improved self-esteem through more pride in appearance, more assertiveness, greater eye contact, and identification of positive traits in self-talk messages. 5. Develop a consistent, positive self-image. 6. Elevate self-esteem. 7. Enhance ability to effectively cope with the full variety of life's worries and anxieties. 8. Establish an inward sense of self-worth, confidence, and competence. 9. Function daily at a consistent level with minimal interference from obsessions and compulsions. 10. Learn and implement coping skills that result in a reduction of anxiety and worry, and improved daily functioning. Objective Learn and implement problem-solving strategies for realistically addressing worries. Target Date: 2021-12-18 Frequency: Biweekly  Progress: 0 Modality: individual  Related Interventions Teach the client problem-solving strategies involving specifically defining a problem, generating options for addressing it, evaluating the pros and cons of each option, selecting and implementing an optional action, and reevaluating and refining the action (or assign "Applying Problem-Solving to Interpersonal Conflict" in the Adult Psychotherapy Homework Planner by [name]). Objective Identify the major life conflicts from the past and present that form the basis for present anxiety. Target Date: 2021-12-18 Frequency: Biweekly  Progress: 0 Modality:  individual  Related Interventions Ask the client to develop and process a list of key past and present life conflicts that continue to cause worry. Assist the client in becoming aware of key unresolved life conflicts and in starting to work toward their resolution. Objective Learn to accept limitations in life and commit to tolerating, rather than avoiding, unpleasant emotions while accomplishing meaningful goals. Target Date: 2021-12-18 Frequency: Biweekly  Progress: 0 Modality: individual  Related Interventions Use techniques from Acceptance and Commitment Therapy to help client accept uncomfortable realities such as lack of complete control, imperfections, and uncertainty and tolerate unpleasant emotions and thoughts in order to accomplish value-consistent goals. Objective Identify and engage in pleasant activities on a daily basis. Target Date: 2021-12-18 Frequency: Biweekly  Progress: 0 Modality: individual  Related Interventions Engage the client in behavioral activation, increasing the client's contact with sources of reward, identifying processes that inhibit activation, and teaching skills to solve life problems (or assign "Identify and Schedule Pleasant Activities" in the Adult Psychotherapy Homework Planner by Jongsma); use behavioral techniques such as instruction, rehearsal, role-playing, role reversal as needed to assist adoption into the client's daily life; reinforce success. Objective Identify, challenge, and replace biased, fearful self-talk with positive, realistic, and empowering self-talk. Target Date: 2021-12-18 Frequency: Biweekly  Progress: 0 Modality: individual  Related Interventions Assign the client a homework exercise in which he/she identifies fearful self-talk, identifies biases in the self-talk, generates alternatives, and tests through behavioral experiments (or assign "Negative Thoughts Trigger Negative Feelings" in the Adult Psychotherapy Homework Planner by  [facility]); review and reinforce success, providing corrective  feedback toward improvement. Explore the client's schema and self-talk that mediate his/her fear response; assist him/her in challenging the biases; replace the distorted messages with reality-based alternatives and positive, realistic self-talk that will increase his/her self-confidence in coping with irrational fears (see Cognitive Therapy of Anxiety Disorders by Laurence Slate). Objective Learn and implement a strategy to limit the association between various environmental settings and worry, delaying the worry until a designated "worry time." Target Date: 2021-12-18 Frequency: Biweekly  Progress: 0 Modality: individual  Related Interventions Teach the client how to recognize, stop, and postpone worry to the agreed upon worry time using skills such as thought stopping, relaxation, and redirecting attention (or assign "Making Use of the Thought-Stopping Technique" and/or "Worry Time" in the Adult Psychotherapy Homework Planner by Jongsma to assist skill development); encourage use in daily life; review and reinforce success while providing corrective feedback toward improvement. Explain the rationale for using a worry time as well as how it is to be used; agree upon and implement a worry time with the client. Objective Learn and implement calming skills to reduce overall anxiety and manage anxiety symptoms. Target Date: 2021-12-18 Frequency: Biweekly  Progress: 0 Modality: individual  Related Interventions Teach the client calming/relaxation skills (e.g., applied relaxation, progressive muscle relaxation, cue-controlled relaxation; mindful breathing; biofeedback) and how to discriminate better between relaxation and tension; teach the client how to apply these skills to his/her daily life (e.g., New Directions in Progressive Muscle Relaxation by Marcelyn Ditty, and Hazlett-Stevens; Treating Generalized Anxiety Disorder by Rygh and  Ida Rogue). 11. Let go of key thoughts, beliefs, and past life events in order to maximize time free from obsessions and compulsions. 12. Reach a level of reduced tension, increased satisfaction, and improved communication with family and/or other authority figures. Objective Increase the number of positive family interactions by planning activities. Target Date: 2021-12-18 Frequency: Biweekly  Progress: 0 Modality: individual  Related Interventions Assist the client in developing a list of positive family activities that promote harmony (e.g., bowling, fishing, playing table games, doing work projects). Schedule such activities into the family calendar. Objective Family members demonstrate increased openness by sharing thoughts and feelings about family dynamics, roles, and expectations. Target Date: 2021-12-18 Frequency: Biweekly  Progress: 0 Modality: individual  Objective Report an increase in resolving conflicts with parents by talking calmly and assertively rather than aggressively and defensively. Target Date: 2021-12-18 Frequency: Biweekly  Progress: 0 Modality: individual  Related Interventions Use role-playing, role reversal, modeling, and behavioral rehearsal to help the client develop assertive ways to resolve conflict with parents (recommend Your Perfect Right: Assertiveness and Equality in Your Life and Relationships by Carnella Guadalajara). 13. Reduce overall frequency, intensity, and duration of the anxiety so that daily functioning is not impaired. 14. Resolve key life conflicts and the emotional stress that fuels obsessive-compulsive behavior patterns. Objective Accept or work to resolve identified life conflicts. Target Date: 2021-12-18 Frequency: Biweekly  Progress: 0 Modality: individual  Related Interventions Explore the resolution of identified interpersonal or other identified life conflicts; assist the client with acceptance of those that cannot be changed or use a  conflict-resolution approach to address those that can. Objective Verbalize an accurate understanding of OCD, how it develops, and how it is maintained. Target Date: 2021-12-18 Frequency: Biweekly  Progress: 0 Modality: individual  Related Interventions Convey a biopsychosocial model for the development and maintenance of OCD highlighting the role of unwarranted fear and avoidance in its maintenance (see Mastery of Obsessive-Compulsive Disorder by Phebe Colla). 15. Stabilize  anxiety level while increasing ability to function on a daily basis  Diagnosis:Generalized anxiety disorder  Depression, major, single episode, mild (HCC)  Plan:  -use alternative coping strategies since she is unable to run at this time. -meet again on Thursday, June 20, 2021 at 1pm.  Teofilo Pod, [facility]               [place], [facility]

## 2021-06-20 ENCOUNTER — Ambulatory Visit: Payer: 59 | Admitting: Professional

## 2021-06-21 ENCOUNTER — Ambulatory Visit: Payer: Managed Care, Other (non HMO) | Admitting: Sports Medicine

## 2021-06-21 ENCOUNTER — Other Ambulatory Visit: Payer: Self-pay

## 2021-06-21 ENCOUNTER — Other Ambulatory Visit: Payer: Self-pay | Admitting: Family Medicine

## 2021-06-21 DIAGNOSIS — G8929 Other chronic pain: Secondary | ICD-10-CM

## 2021-06-21 DIAGNOSIS — F320 Major depressive disorder, single episode, mild: Secondary | ICD-10-CM

## 2021-06-21 DIAGNOSIS — F429 Obsessive-compulsive disorder, unspecified: Secondary | ICD-10-CM

## 2021-06-21 DIAGNOSIS — M25561 Pain in right knee: Secondary | ICD-10-CM

## 2021-06-21 NOTE — Progress Notes (Signed)
? ? ?  Procedures performed today:   ? ?None. ? ?Independent interpretation of notes and tests performed by another provider:  ? ?None. ? ?Brief History, Exam, Impression, and Recommendations:   ? ?Right knee pain ?Charlotte Sanes is a pleasant 26 year old female, she is a marathon runner, she was historically doing about 30 miles per week before developing pain that she localized on the lateral joint line of her right knee. ?She had pain about 2 weeks prior to her initial visit with me on 27 January, unfortunately she continues to have knee pain through today in spite of greater than 6 weeks of physician directed physical therapy, oral analgesics and activity modification. ?X-rays were unrevealing. ?Her hip abductor strength was weak on the right, strength is now symmetric so she really has been doing her conditioning exercises. ?On exam she has pain with terminal flexion and a positive McMurray's sign. ?Due to failure of conservative treatment we will proceed with MRI for concerns of stress injury or lateral meniscal tear. ? ? ? ?___________________________________________ ?Ihor Austin. Benjamin Stain, M.D., ABFM., CAQSM. ?Primary Care and Sports Medicine ?[facility] ? ?Adjunct Instructor of Family Medicine  ?[employer] ?

## 2021-06-21 NOTE — Assessment & Plan Note (Signed)
Katherine Michael is a pleasant 26 year old female, she is a marathon runner, she was historically doing about 30 miles per week before developing pain that she localized on the lateral joint line of her right knee. ?She had pain about 2 weeks prior to her initial visit with me on 27 January, unfortunately she continues to have knee pain through today in spite of greater than 6 weeks of physician directed physical therapy, oral analgesics and activity modification. ?X-rays were unrevealing. ?Her hip abductor strength was weak on the right, strength is now symmetric so she really has been doing her conditioning exercises. ?On exam she has pain with terminal flexion and a positive McMurray's sign. ?Due to failure of conservative treatment we will proceed with MRI for concerns of stress injury or lateral meniscal tear. ?

## 2021-06-25 ENCOUNTER — Encounter: Payer: Self-pay | Admitting: Professional

## 2021-06-25 ENCOUNTER — Ambulatory Visit (INDEPENDENT_AMBULATORY_CARE_PROVIDER_SITE_OTHER): Payer: 59 | Admitting: Professional

## 2021-06-25 DIAGNOSIS — F411 Generalized anxiety disorder: Secondary | ICD-10-CM

## 2021-06-25 DIAGNOSIS — F320 Major depressive disorder, single episode, mild: Secondary | ICD-10-CM | POA: Diagnosis not present

## 2021-06-25 NOTE — Progress Notes (Signed)
[facility] Counselor/Therapist Progress Note ? ?Patient ID: Katherine Michael, MRN: [id number],   ? ?Date: 06/25/2021 ? ?Time Spent: 31 minutes [phone] am ? ?Treatment Type: Individual Therapy ? ?Reported Symptoms: calm, engaged, goal-directed ? ?Mental Status Exam: ?Appearance:  Casual      ?Behavior: Appropriate and Sharing  ?Motor: Normal  ?Speech/Language:  Clear and Coherent and Normal Rate  ?Affect: Full Range  ?Mood: pleasant, content  ?Thought process: goal directed  ?Thought content:   WNL  ?Sensory/Perceptual disturbances:   WNL  ?Orientation: oriented to person, place, time/date, and situation  ?Attention: Good  ?Concentration: Good  ?Memory: WNL  ?Fund of knowledge:  Good  ?Insight:   Good  ?Judgment:  Good  ?Impulse Control: Good  ? ?Risk Assessment: ?Danger to Self:  No ?Self-injurious Behavior: No ?Danger to Others: No ?Duty to Warn:no ?Physical Aggression / Violence:No  ?Access to Firearms a concern: No  ?Gang Involvement:No  ? ?Subjective:  This session was held via video teletherapy due to the coronavirus risk at this time. The patient consented to video teletherapy and was located at her home during this session. She is aware it is the responsibility of the patient to secure confidentiality on her end of the session. The provider was in a private home office for the duration of this session.  ? ?Issues addressed: ?1-medical ?-pt likely has a torn meniscus ?-pt experiences pain with walking ?-pt tells herself she is a wimp and that it's not as big a deal ?2-stressors ?-the same as usual ?  -sister-in-law issues ?    -has tried to not allow her to effect her as much ?    -she has had some panic about how to conceive and how that will play out ?      -how her SIL will handle it ?3-PHQ-9/GAD-7 ?Depression screen [facility] 2/9 06/25/2021 12/13/2020  ?Decreased Interest 0 1  ?Down, Depressed, Hopeless 0 0  ?PHQ - 2 Score 0 1  ?Altered sleeping 1 0  ?Tired, decreased energy 1 1  ?Change in appetite 0  0  ?Feeling bad or failure about yourself  1 0  ?Trouble concentrating 0 0  ?Moving slowly or fidgety/restless 0 0  ?Suicidal thoughts 0 0  ?PHQ-9 Score 3 2  ?Difficult doing work/chores Not difficult at all Somewhat difficult  ?Some recent data might be hidden  ? ? ?-excessive sleepiness appears hereditary ?-her father has sleep issues as well ?-she has hypersomnia ?-she has never not been sleepy ?-she has fallen asleep standing up, has fallen asleep mid-conversation, she has nodded off behind the wheel ?-sleep Dr suggested a stimulant because she does not want to take ?  -he suggested it was common among people on antidepressants ? ?Problems Addressed  ?Anxiety, Family Conflict, Low Self-Esteem, Obsessive-Compulsive Disorder (OCD)  ?Goals ?1. Accept the presence of obsessive thoughts without acting on them and commit to a value-driven life. ?Objective ?Gain insight into how childhood experiences might influence current struggles with OCD and take appropriate actions. ?Target Date: 2021-12-18 Frequency: every three weeks  ?Progress: 0 Modality: individual  ?Objective ?Engage in a strategic ordeal to overcome OCD impulses. ?Target Date: 2021-12-18 Frequency: every three weeks  ?Progress: 0 Modality: individual  ?Objective ?Identify and discuss unresolved life conflicts. ?Target Date: 2021-12-18 Frequency: every three weeks  ?Progress: 0 Modality: individual  ?Related Interventions ?Explore the client's life circumstances to help identify key unresolved conflicts that may underlie OCD. ?Objective ?Keep a daily journal of obsessions, compulsions, and triggers; record thoughts,  feelings, and actions taken. ?Target Date: 2021-12-18 Frequency: every three weeks  ?Progress: 0 Modality: individual  ?Related Interventions ?Ask the client to self-monitor obsessions, compulsions, and triggers; record thoughts, feelings, and actions taken; routinely process the data to facilitate the accomplishment of therapeutic objectives  (or assign "Analyze the Probability of a Feared Event" in the Adult Psychotherapy Homework Planner by Stephannie LiJongsma). ?2. Achieve a reasonable level of family connectedness and harmony where members support, help, and are concerned for each other. ?3. Decrease the level of present conflict with parents while beginning to let go of or resolving past conflicts with them. ?4. Demonstrate improved self-esteem through more pride in appearance, more assertiveness, greater eye contact, and identification of positive traits in self-talk messages. ?5. Develop a consistent, positive self-image. ?6. Elevate self-esteem. ?7. Enhance ability to effectively cope with the full variety of life's worries and anxieties. ?8. Establish an inward sense of self-worth, confidence, and competence. ?9. Function daily at a consistent level with minimal interference from obsessions and compulsions. ?10. Learn and implement coping skills that result in a reduction of anxiety and worry, and improved daily functioning. ?Objective ?Learn and implement problem-solving strategies for realistically addressing worries. ?Target Date: 2021-12-18 Frequency: every three weeks  ?Progress: 0 Modality: individual  ?Related Interventions ?Teach the client problem-solving strategies involving specifically defining a problem, generating options for addressing it, evaluating the pros and cons of each option, selecting and implementing an optional action, and reevaluating and refining the action (or assign "Applying Problem-Solving to Interpersonal Conflict" in the Adult Psychotherapy Homework Planner by Stephannie LiJongsma). ?Objective ?Identify the major life conflicts from the past and present that form the basis for present anxiety. ?Target Date: 2021-12-18 Frequency: every three weeks  ?Progress: 0 Modality: individual  ?Related Interventions ?Ask the client to develop and process a list of key past and present life conflicts that continue to cause worry. ?Assist the client  in becoming aware of key unresolved life conflicts and in starting to work toward their resolution. ?Objective ?Learn to accept limitations in life and commit to tolerating, rather than avoiding, unpleasant emotions while accomplishing meaningful goals. ?Target Date: 2021-12-18 Frequency: every three weeks  ?Progress: 0 Modality: individual  ?Related Interventions ?Use techniques from Acceptance and Commitment Therapy to help client accept uncomfortable realities such as lack of complete control, imperfections, and uncertainty and tolerate unpleasant emotions and thoughts in order to accomplish value-consistent goals. ?Objective ?Identify and engage in pleasant activities on a daily basis. ?Target Date: 2021-12-18 Frequency: every three weeks  ?Progress: 0 Modality: individual  ?Related Interventions ?Engage the client in behavioral activation, increasing the client's contact with sources of reward, identifying processes that inhibit activation, and teaching skills to solve life problems (or assign "Identify and Schedule Pleasant Activities" in the Adult Psychotherapy Homework Planner by Stephannie LiJongsma); use behavioral techniques such as instruction, rehearsal, role-playing, role reversal as needed to assist adoption into the client's daily life; reinforce success. ?Objective ?Identify, challenge, and replace biased, fearful self-talk with positive, realistic, and empowering self-talk. ?Target Date: 2021-12-18 Frequency: every three weeks  ?Progress: 0 Modality: individual  ?Related Interventions ?Assign the client a homework exercise in which he/she identifies fearful self-talk, identifies biases in the self-talk, generates alternatives, and tests through behavioral experiments (or assign "Negative Thoughts Trigger Negative Feelings" in the Adult Psychotherapy Homework Planner by [facility]); review and reinforce success, providing corrective feedback toward improvement. ?Explore the client's schema and self-talk that  mediate his/her fear response; assist him/her in challenging the biases; replace the distorted messages with  reality-based alternatives and positive, realistic self-talk that will increase his/her self-confid

## 2021-06-28 ENCOUNTER — Encounter: Payer: Self-pay | Admitting: Sports Medicine

## 2021-07-01 ENCOUNTER — Other Ambulatory Visit: Payer: Managed Care, Other (non HMO)

## 2021-07-03 ENCOUNTER — Other Ambulatory Visit: Payer: Self-pay | Admitting: *Deleted

## 2021-07-03 ENCOUNTER — Ambulatory Visit: Payer: 59 | Admitting: Professional

## 2021-07-03 NOTE — Telephone Encounter (Signed)
Please call pt and advise her that we received a refill request from her pharmacy for her Sertraline 100 mg she is OVERDUE for a f/u to receive a refill on this medication. Ok for virtual. Will send enough to get her thru until she can get in sometime in April when Dr. Linford Arnold has an opening? Thanks ? ?

## 2021-07-08 ENCOUNTER — Encounter: Payer: Self-pay | Admitting: Sports Medicine

## 2021-07-15 ENCOUNTER — Encounter: Payer: Self-pay | Admitting: Professional

## 2021-07-15 ENCOUNTER — Ambulatory Visit (INDEPENDENT_AMBULATORY_CARE_PROVIDER_SITE_OTHER): Payer: 59 | Admitting: Professional

## 2021-07-15 DIAGNOSIS — F320 Major depressive disorder, single episode, mild: Secondary | ICD-10-CM

## 2021-07-15 DIAGNOSIS — F411 Generalized anxiety disorder: Secondary | ICD-10-CM

## 2021-07-15 NOTE — Progress Notes (Signed)
[facility] Counselor/Therapist Progress Note ? ?Patient ID: Katherine Michael, MRN: [id number],   ? ?Date: 07/15/2021 ? ?Time Spent: 44 minutes 01-1043 am ? ?Treatment Type: Individual Therapy ? ?Risk Assessment: ?Danger to Self:  No ?Self-injurious Behavior: No ?Danger to Others: No ?Duty to Warn:no ? ?Subjective:  This session was held via video teletherapy due to the coronavirus risk at this time. The patient consented to video teletherapy and was located at her home during this session. She is aware it is the responsibility of the patient to secure confidentiality on her end of the session. The provider was in a private home office for the duration of this session.  ? ?The patient arrived on time for her webex session. ? ?Issues addressed: ?1-medical ?-pt MRI results reveal nothing is torn ?-will see MD on 18th for injection ?  -will likely only have a few days off ?2-trying to have children ?-feel discouraged that she is not yet pregnant ?-she told herself that she will not be obsessive over it ?-already been difficult ?  -teaching her that she needs to trust God in the process ?-she knows logically that it could take time ?-pt reports she doesn't overwhelm [name] with her thinking ?-pt feels guilty that she is fixated on not conceiving in the first month when some women cannot get pregnant ?-consider no more tracking of her cycle ?-she has things that she can be excited about and has lost some due to obsessing about pregnancy ?  Katherine Michael with girlfriend's on April 30th in [place] ?  -Katherine Michael with dad in May ?3-control issues ?-how this impacts her stress ?-coping skills ?-spirituality ?  -trusting her higher power (God) ?  -example of trusting her spouse with finances ?    -pt identified how much that hits home with her ? ?Problems Addressed  ?Anxiety, Family Conflict, Low Self-Esteem, Obsessive-Compulsive Disorder (OCD)  ?Goals ?1. Accept the presence of obsessive thoughts without acting  on them and commit to a value-driven life. ?Objective ?Gain insight into how childhood experiences might influence current struggles with OCD and take appropriate actions. ?Target Date: 2021-12-18 Frequency: every three weeks  ?Progress: 0 Modality: individual  ?Objective ?Engage in a strategic ordeal to overcome OCD impulses. ?Target Date: 2021-12-18 Frequency: every three weeks  ?Progress: 0 Modality: individual  ?Objective ?Identify and discuss unresolved life conflicts. ?Target Date: 2021-12-18 Frequency: every three weeks  ?Progress: 0 Modality: individual  ?Related Interventions ?Explore the client's life circumstances to help identify key unresolved conflicts that may underlie OCD. ?Objective ?Keep a daily journal of obsessions, compulsions, and triggers; record thoughts, feelings, and actions taken. ?Target Date: 2021-12-18 Frequency: every three weeks  ?Progress: 0 Modality: individual  ?Related Interventions ?Ask the client to self-monitor obsessions, compulsions, and triggers; record thoughts, feelings, and actions taken; routinely process the data to facilitate the accomplishment of therapeutic objectives (or assign "Analyze the Probability of a Feared Event" in the Adult Psychotherapy Homework Planner by [name]). ?2. Achieve a reasonable level of family connectedness and harmony where members support, help, and are concerned for each other. ?3. Decrease the level of present conflict with parents while beginning to let go of or resolving past conflicts with them. ?4. Demonstrate improved self-esteem through more pride in appearance, more assertiveness, greater eye contact, and identification of positive traits in self-talk messages. ?5. Develop a consistent, positive self-image. ?6. Elevate self-esteem. ?7. Enhance ability to effectively cope with the full variety of life's worries and anxieties. ?8. Establish an inward sense  of self-worth, confidence, and competence. ?9. Function daily at a consistent  level with minimal interference from obsessions and compulsions. ?10. Learn and implement coping skills that result in a reduction of anxiety and worry, and improved daily functioning. ?Objective ?Learn and implement problem-solving strategies for realistically addressing worries. ?Target Date: 2021-12-18 Frequency: every three weeks  ?Progress: 0 Modality: individual  ?Related Interventions ?Teach the client problem-solving strategies involving specifically defining a problem, generating options for addressing it, evaluating the pros and cons of each option, selecting and implementing an optional action, and reevaluating and refining the action (or assign "Applying Problem-Solving to Interpersonal Conflict" in the Adult Psychotherapy Homework Planner by [name]). ?Objective ?Identify the major life conflicts from the past and present that form the basis for present anxiety. ?Target Date: 2021-12-18 Frequency: every three weeks  ?Progress: 0 Modality: individual  ?Related Interventions ?Ask the client to develop and process a list of key past and present life conflicts that continue to cause worry. ?Assist the client in becoming aware of key unresolved life conflicts and in starting to work toward their resolution. ?Objective ?Learn to accept limitations in life and commit to tolerating, rather than avoiding, unpleasant emotions while accomplishing meaningful goals. ?Target Date: 2021-12-18 Frequency: every three weeks  ?Progress: 0 Modality: individual  ?Related Interventions ?Use techniques from Acceptance and Commitment Therapy to help client accept uncomfortable realities such as lack of complete control, imperfections, and uncertainty and tolerate unpleasant emotions and thoughts in order to accomplish value-consistent goals. ?Objective ?Identify and engage in pleasant activities on a daily basis. ?Target Date: 2021-12-18 Frequency: every three weeks  ?Progress: 0 Modality: individual  ?Related  Interventions ?Engage the client in behavioral activation, increasing the client's contact with sources of reward, identifying processes that inhibit activation, and teaching skills to solve life problems (or assign "Identify and Schedule Pleasant Activities" in the Adult Psychotherapy Homework Planner by [name]); use behavioral techniques such as instruction, rehearsal, role-playing, role reversal as needed to assist adoption into the client's daily life; reinforce success. ?Objective ?Identify, challenge, and replace biased, fearful self-talk with positive, realistic, and empowering self-talk. ?Target Date: 2021-12-18 Frequency: every three weeks  ?Progress: 0 Modality: individual  ?Related Interventions ?Assign the client a homework exercise in which he/she identifies fearful self-talk, identifies biases in the self-talk, generates alternatives, and tests through behavioral experiments (or assign "Negative Thoughts Trigger Negative Feelings" in the Adult Psychotherapy Homework Planner by [facility]); review and reinforce success, providing corrective feedback toward improvement. ?Explore the client's schema and self-talk that mediate his/her fear response; assist him/her in challenging the biases; replace the distorted messages with reality-based alternatives and positive, realistic self-talk that will increase his/her self-confidence in coping with irrational fears (see Cognitive Therapy of Anxiety Disorders by Laurence Slate). ?Objective ?Learn and implement a strategy to limit the association between various environmental settings and worry, delaying the worry until a designated "worry time." ?Target Date: 2021-12-18 Frequency: every three weeks  ?Progress: 0 Modality: individual  ?Related Interventions ?Teach the client how to recognize, stop, and postpone worry to the agreed upon worry time using skills such as thought stopping, relaxation, and redirecting attention (or assign "Making Use of the  Thought-Stopping Technique" and/or "Worry Time" in the Adult Psychotherapy Homework Planner by Jongsma to assist skill development); encourage use in daily life; review and reinforce success while providing corrective feedback towa

## 2021-07-30 ENCOUNTER — Ambulatory Visit: Payer: Managed Care, Other (non HMO) | Admitting: Sports Medicine

## 2021-07-30 ENCOUNTER — Ambulatory Visit (INDEPENDENT_AMBULATORY_CARE_PROVIDER_SITE_OTHER): Payer: Managed Care, Other (non HMO)

## 2021-07-30 DIAGNOSIS — G8929 Other chronic pain: Secondary | ICD-10-CM

## 2021-07-30 DIAGNOSIS — M25561 Pain in right knee: Secondary | ICD-10-CM

## 2021-07-30 NOTE — Assessment & Plan Note (Addendum)
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 ?Unfortunately she failed greater than 6 weeks of physical therapy, oral analgesics and activity modification with unrevealing x-rays, MRI was obtained that showed cartilage blistering retropatellar, joint effusion with synovitis and a potential medial plica as well as some medial parameniscal cystic changes. ?Today we injected her knee, she can return to see me in 4 to 6 weeks, referral for arthroscopy if not better. ?

## 2021-07-30 NOTE — Progress Notes (Signed)
? ? ?  Procedures performed today:   ? ?Procedure: Real-time Ultrasound Guided injection of the right knee ?Device: Samsung HS60  ?Verbal informed consent obtained.  ?Time-out conducted.  ?Noted no overlying erythema, induration, or other signs of local infection.  ?Skin prepped in a sterile fashion.  ?Local anesthesia: Topical Ethyl chloride.  ?With sterile technique and under real time ultrasound guidance: No effusion noted, 1 cc Kenalog 40, 2 cc lidocaine, 2 cc bupivacaine injected easily ?Completed without difficulty  ?Advised to call if fevers/chills, erythema, induration, drainage, or persistent bleeding.  ?Images permanently stored and available for review in PACS.  ?Impression: Technically successful ultrasound guided injection. ? ?Independent interpretation of notes and tests performed by another provider:  ? ?None. ? ?Brief History, Exam, Impression, and Recommendations:   ? 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 ?Unfortunately she failed greater than 6 weeks of physical therapy, oral analgesics and activity modification with unrevealing x-rays, MRI was obtained that showed cartilage blistering retropatellar, joint effusion with synovitis and a potential medial plica as well as some medial parameniscal cystic changes. ?Today we injected her knee, she can return to see me in 4 to 6 weeks, referral for arthroscopy if not better. ? ? ? ?___________________________________________ ?Ihor Austin. Benjamin Stain, M.D., ABFM., CAQSM. ?Primary Care and Sports Medicine ?[facility] ? ?Adjunct Instructor of Family Medicine  ?[employer] ?

## 2021-08-06 ENCOUNTER — Encounter: Payer: Self-pay | Admitting: Professional

## 2021-08-06 ENCOUNTER — Ambulatory Visit (INDEPENDENT_AMBULATORY_CARE_PROVIDER_SITE_OTHER): Payer: 59 | Admitting: Professional

## 2021-08-06 DIAGNOSIS — F320 Major depressive disorder, single episode, mild: Secondary | ICD-10-CM

## 2021-08-06 DIAGNOSIS — F411 Generalized anxiety disorder: Secondary | ICD-10-CM | POA: Diagnosis not present

## 2021-08-06 NOTE — Progress Notes (Signed)
[facility] Counselor/Therapist Progress Note ? ?Patient ID: Katherine Michael, MRN: [id number],   ? ?Date: 08/06/2021 ? ?Time Spent: 29 minutes 1001-1030am ? ?Treatment Type: Individual Therapy ? ?Risk Assessment: ?Danger to Self:  No ?Self-injurious Behavior: No ?Danger to Others: No ?Duty to Warn:no ? ?Subjective:  This session was held via video teletherapy due to the coronavirus risk at this time. The patient consented to video teletherapy and was located at her home during this session. She is aware it is the responsibility of the patient to secure confidentiality on her end of the session. The provider was in a private home office for the duration of this session.  ? ?The patient arrived on time for her webex session. ? ?Issues addressed: ?1-anxiety ?-pt reports her anxiety has been manageable ?-she tends to worry about her fertility and getting pregnant ?-pt reports she is not as obsessive as previous session ?-discussed strategies to help manage her desire to control ?-pt relying somewhat on her spirituality  ?-likes to know about and understand her body ?-admits that instant access to information is not helpful to her ?    -knows it is best that she not research as much ? ?Problems Addressed  ?Anxiety, Family Conflict, Low Self-Esteem, Obsessive-Compulsive Disorder (OCD)  ?Goals ?1. Accept the presence of obsessive thoughts without acting on them and commit to a value-driven life. ?Objective ?Gain insight into how childhood experiences might influence current struggles with OCD and take appropriate actions. ?Target Date: 2021-12-18 Frequency: every three weeks  ?Progress: 0 Modality: individual  ?Objective ?Engage in a strategic ordeal to overcome OCD impulses. ?Target Date: 2021-12-18 Frequency: every three weeks  ?Progress: 0 Modality: individual  ?Objective ?Identify and discuss unresolved life conflicts. ?Target Date: 2021-12-18 Frequency: every three weeks  ?Progress: 0 Modality: individual   ?Related Interventions ?Explore the client's life circumstances to help identify key unresolved conflicts that may underlie OCD. ?Objective ?Keep a daily journal of obsessions, compulsions, and triggers; record thoughts, feelings, and actions taken. ?Target Date: 2021-12-18 Frequency: every three weeks  ?Progress: 0 Modality: individual  ?Related Interventions ?Ask the client to self-monitor obsessions, compulsions, and triggers; record thoughts, feelings, and actions taken; routinely process the data to facilitate the accomplishment of therapeutic objectives (or assign "Analyze the Probability of a Feared Event" in the Adult Psychotherapy Homework Planner by [name]). ?2. Achieve a reasonable level of family connectedness and harmony where members support, help, and are concerned for each other. ?3. Decrease the level of present conflict with parents while beginning to let go of or resolving past conflicts with them. ?4. Demonstrate improved self-esteem through more pride in appearance, more assertiveness, greater eye contact, and identification of positive traits in self-talk messages. ?5. Develop a consistent, positive self-image. ?6. Elevate self-esteem. ?7. Enhance ability to effectively cope with the full variety of life's worries and anxieties. ?8. Establish an inward sense of self-worth, confidence, and competence. ?9. Function daily at a consistent level with minimal interference from obsessions and compulsions. ?10. Learn and implement coping skills that result in a reduction of anxiety and worry, and improved daily functioning. ?Objective ?Learn and implement problem-solving strategies for realistically addressing worries. ?Target Date: 2021-12-18 Frequency: every three weeks  ?Progress: 0 Modality: individual  ?Related Interventions ?Teach the client problem-solving strategies involving specifically defining a problem, generating options for addressing it, evaluating the pros and cons of each option,  selecting and implementing an optional action, and reevaluating and refining the action (or assign "Applying Problem-Solving to Interpersonal Conflict" in the  Adult Psychotherapy Administrator, arts by [name]). ?Objective ?Identify the major life conflicts from the past and present that form the basis for present anxiety. ?Target Date: 2021-12-18 Frequency: every three weeks  ?Progress: 0 Modality: individual  ?Related Interventions ?Ask the client to develop and process a list of key past and present life conflicts that continue to cause worry. ?Assist the client in becoming aware of key unresolved life conflicts and in starting to work toward their resolution. ?Objective ?Learn to accept limitations in life and commit to tolerating, rather than avoiding, unpleasant emotions while accomplishing meaningful goals. ?Target Date: 2021-12-18 Frequency: every three weeks  ?Progress: 0 Modality: individual  ?Related Interventions ?Use techniques from Acceptance and Commitment Therapy to help client accept uncomfortable realities such as lack of complete control, imperfections, and uncertainty and tolerate unpleasant emotions and thoughts in order to accomplish value-consistent goals. ?Objective ?Identify and engage in pleasant activities on a daily basis. ?Target Date: 2021-12-18 Frequency: every three weeks  ?Progress: 0 Modality: individual  ?Related Interventions ?Engage the client in behavioral activation, increasing the client's contact with sources of reward, identifying processes that inhibit activation, and teaching skills to solve life problems (or assign "Identify and Schedule Pleasant Activities" in the Adult Psychotherapy Homework Planner by [name]); use behavioral techniques such as instruction, rehearsal, role-playing, role reversal as needed to assist adoption into the client's daily life; reinforce success. ?Objective ?Identify, challenge, and replace biased, fearful self-talk with positive, realistic, and  empowering self-talk. ?Target Date: 2021-12-18 Frequency: every three weeks  ?Progress: 0 Modality: individual  ?Related Interventions ?Assign the client a homework exercise in which he/she identifies fearful self-talk, identifies biases in the self-talk, generates alternatives, and tests through behavioral experiments (or assign "Negative Thoughts Trigger Negative Feelings" in the Adult Psychotherapy Homework Planner by [facility]); review and reinforce success, providing corrective feedback toward improvement. ?Explore the client's schema and self-talk that mediate his/her fear response; assist him/her in challenging the biases; replace the distorted messages with reality-based alternatives and positive, realistic self-talk that will increase his/her self-confidence in coping with irrational fears (see Cognitive Therapy of Anxiety Disorders by Laurence Slate). ?Objective ?Learn and implement a strategy to limit the association between various environmental settings and worry, delaying the worry until a designated "worry time." ?Target Date: 2021-12-18 Frequency: every three weeks  ?Progress: 0 Modality: individual  ?Related Interventions ?Teach the client how to recognize, stop, and postpone worry to the agreed upon worry time using skills such as thought stopping, relaxation, and redirecting attention (or assign "Making Use of the Thought-Stopping Technique" and/or "Worry Time" in the Adult Psychotherapy Homework Planner by Jongsma to assist skill development); encourage use in daily life; review and reinforce success while providing corrective feedback toward improvement. ?Explain the rationale for using a worry time as well as how it is to be used; agree upon and implement a worry time with the client. ?Objective ?Learn and implement calming skills to reduce overall anxiety and manage anxiety symptoms. ?Target Date: 2021-12-18 Frequency: every three weeks  ?Progress: 0 Modality: individual  ?Related  Interventions ?Teach the client calming/relaxation skills (e.g., applied relaxation, progressive muscle relaxation, cue-controlled relaxation; mindful breathing; biofeedback) and how to discriminate better between r

## 2021-08-19 ENCOUNTER — Encounter: Payer: Self-pay | Admitting: Family Medicine

## 2021-08-19 DIAGNOSIS — F429 Obsessive-compulsive disorder, unspecified: Secondary | ICD-10-CM

## 2021-08-19 DIAGNOSIS — F320 Major depressive disorder, single episode, mild: Secondary | ICD-10-CM

## 2021-08-19 MED ORDER — SERTRALINE HCL 100 MG PO TABS: ORAL_TABLET | ORAL | 0 refills | Status: DC

## 2021-09-04 ENCOUNTER — Ambulatory Visit (INDEPENDENT_AMBULATORY_CARE_PROVIDER_SITE_OTHER): Payer: 59 | Admitting: Professional

## 2021-09-04 ENCOUNTER — Ambulatory Visit: Payer: 59 | Admitting: Professional

## 2021-09-04 ENCOUNTER — Encounter: Payer: Self-pay | Admitting: Professional

## 2021-09-04 DIAGNOSIS — F320 Major depressive disorder, single episode, mild: Secondary | ICD-10-CM | POA: Diagnosis not present

## 2021-09-04 DIAGNOSIS — F411 Generalized anxiety disorder: Secondary | ICD-10-CM

## 2021-09-04 NOTE — Progress Notes (Signed)
[facility] Counselor/Therapist Progress Note  Patient ID: Katherine Michael, MRN: [id number],    Date: 09/04/2021  Time Spent: 43 minutes 1101-1144am  Treatment Type: Individual Therapy  Risk Assessment: Danger to Self:  No Self-injurious Behavior: No Danger to Others: No Duty to Warn:no  Subjective:  This session was held via video teletherapy. The patient consented to video teletherapy and was located at her home during this session. She is aware it is the responsibility of the patient to secure confidentiality on her end of the session. The provider was in a private home office for the duration of this session.   The patient arrived on time for her webex session.  Issues addressed: 1-homework-completed -utilize scheduled times to review fertility -pt verbalizes that she is not obsessing nearly as much 2-anxiety -she and spouse are attending church and joined a small group   -disclosed to ladies that she has been struggling with anxiety and conception -she has been told she is "too sensitive" her whole life   -[name] share with her that "she tends to approach situations like I'm not on your team"   -she recognized that she does think that people are out to get her     -reports it is becoming detrimental to daily life -pt is unsure where her negative thinking comes from -she is not into girly girl things, it is not a passion -pt cares about her character -pt admits she does not get sarcasm and finds herself apologizing   -when she checks with spouse he has never identified a remark made by someone as aimed to hurt the patient   -provided framework to challenge the thoughts   -provided several articles for pt to review on how to understand sarcasm -pt admits she struggles with feeling that people do not like her   -reviewed with patient what people observe in those that are liked  Problems Addressed  Anxiety, Family Conflict, Low Self-Esteem, Obsessive-Compulsive  Disorder (OCD)  Goals 1. Accept the presence of obsessive thoughts without acting on them and commit to a value-driven life. Objective Gain insight into how childhood experiences might influence current struggles with OCD and take appropriate actions. Target Date: 2021-12-18 Frequency: every three weeks  Progress: 0 Modality: individual  Objective Engage in a strategic ordeal to overcome OCD impulses. Target Date: 2021-12-18 Frequency: every three weeks  Progress: 0 Modality: individual  Objective Identify and discuss unresolved life conflicts. Target Date: 2021-12-18 Frequency: every three weeks  Progress: 0 Modality: individual  Related Interventions Explore the client's life circumstances to help identify key unresolved conflicts that may underlie OCD. Objective Keep a daily journal of obsessions, compulsions, and triggers; record thoughts, feelings, and actions taken. Target Date: 2021-12-18 Frequency: every three weeks  Progress: 0 Modality: individual  Related Interventions Ask the client to self-monitor obsessions, compulsions, and triggers; record thoughts, feelings, and actions taken; routinely process the data to facilitate the accomplishment of therapeutic objectives (or assign "Analyze the Probability of a Feared Event" in the Adult Psychotherapy Homework Planner by Bryn Gulling). 2. Achieve a reasonable level of family connectedness and harmony where members support, help, and are concerned for each other. 3. Decrease the level of present conflict with parents while beginning to let go of or resolving past conflicts with them. 4. Demonstrate improved self-esteem through more pride in appearance, more assertiveness, greater eye contact, and identification of positive traits in self-talk messages. 5. Develop a consistent, positive self-image. 6. Elevate self-esteem. 7. Enhance ability to effectively cope with the  full variety of life's worries and anxieties. 8. Establish an inward  sense of self-worth, confidence, and competence. 9. Function daily at a consistent level with minimal interference from obsessions and compulsions. 10. Learn and implement coping skills that result in a reduction of anxiety and worry, and improved daily functioning. Objective Learn and implement problem-solving strategies for realistically addressing worries. Target Date: 2021-12-18 Frequency: every three weeks  Progress: 0 Modality: individual  Related Interventions Teach the client problem-solving strategies involving specifically defining a problem, generating options for addressing it, evaluating the pros and cons of each option, selecting and implementing an optional action, and reevaluating and refining the action (or assign "Applying Problem-Solving to Interpersonal Conflict" in the Adult Psychotherapy Homework Planner by Bryn Gulling). Objective Identify the major life conflicts from the past and present that form the basis for present anxiety. Target Date: 2021-12-18 Frequency: every three weeks  Progress: 0 Modality: individual  Related Interventions Ask the client to develop and process a list of key past and present life conflicts that continue to cause worry. Assist the client in becoming aware of key unresolved life conflicts and in starting to work toward their resolution. Objective Learn to accept limitations in life and commit to tolerating, rather than avoiding, unpleasant emotions while accomplishing meaningful goals. Target Date: 2021-12-18 Frequency: every three weeks  Progress: 0 Modality: individual  Related Interventions Use techniques from Acceptance and Commitment Therapy to help client accept uncomfortable realities such as lack of complete control, imperfections, and uncertainty and tolerate unpleasant emotions and thoughts in order to accomplish value-consistent goals. Objective Identify and engage in pleasant activities on a daily basis. Target Date: 2021-12-18  Frequency: every three weeks  Progress: 0 Modality: individual  Related Interventions Engage the client in behavioral activation, increasing the client's contact with sources of reward, identifying processes that inhibit activation, and teaching skills to solve life problems (or assign "Identify and Schedule Pleasant Activities" in the Adult Psychotherapy Homework Planner by Bryn Gulling); use behavioral techniques such as instruction, rehearsal, role-playing, role reversal as needed to assist adoption into the client's daily life; reinforce success. Objective Identify, challenge, and replace biased, fearful self-talk with positive, realistic, and empowering self-talk. Target Date: 2021-12-18 Frequency: every three weeks  Progress: 0 Modality: individual  Related Interventions Assign the client a homework exercise in which he/she identifies fearful self-talk, identifies biases in the self-talk, generates alternatives, and tests through behavioral experiments (or assign "Negative Thoughts Trigger Negative Feelings" in the Adult Psychotherapy Homework Planner by [facility]); review and reinforce success, providing corrective feedback toward improvement. Explore the client's schema and self-talk that mediate his/her fear response; assist him/her in challenging the biases; replace the distorted messages with reality-based alternatives and positive, realistic self-talk that will increase his/her self-confidence in coping with irrational fears (see Cognitive Therapy of Anxiety Disorders by Alison Stalling). Objective Learn and implement a strategy to limit the association between various environmental settings and worry, delaying the worry until a designated "worry time." Target Date: 2021-12-18 Frequency: every three weeks  Progress: 0 Modality: individual  Related Interventions Teach the client how to recognize, stop, and postpone worry to the agreed upon worry time using skills such as thought stopping,  relaxation, and redirecting attention (or assign "Making Use of the Thought-Stopping Technique" and/or "Worry Time" in the Adult Psychotherapy Homework Planner by Jongsma to assist skill development); encourage use in daily life; review and reinforce success while providing corrective feedback toward improvement. Explain the rationale for using a worry time as well as how it  is to be used; agree upon and implement a worry time with the client. Objective Learn and implement calming skills to reduce overall anxiety and manage anxiety symptoms. Target Date: 2021-12-18 Frequency: every three weeks  Progress: 0 Modality: individual  Related Interventions Teach the client calming/relaxation skills (e.g., applied relaxation, progressive muscle relaxation, cue-controlled relaxation; mindful breathing; biofeedback) and how to discriminate better between relaxation and tension; teach the client how to apply these skills to his/her daily life (e.g., New Directions in Progressive Muscle Relaxation by Casper Harrison, and Hazlett-Stevens; Treating Generalized Anxiety Disorder by Rygh and Amparo Bristol). 11. Let go of key thoughts, beliefs, and past life events in order to maximize time free from obsessions and compulsions. 12. Reach a level of reduced tension, increased satisfaction, and improved communication with family and/or other authority figures. Objective Increase the number of positive family interactions by planning activities. Target Date: 2021-12-18 Frequency: every three weeks  Progress: 0 Modality: individual  Related Interventions Assist the client in developing a list of positive family activities that promote harmony (e.g., bowling, fishing, playing table games, doing work projects). Schedule such activities into the family calendar. Objective Family members demonstrate increased openness by sharing thoughts and feelings about family dynamics, roles, and expectations. Target Date: 2021-12-18  Frequency: every three weeks  Progress: 0 Modality: individual  Objective Report an increase in resolving conflicts with parents by talking calmly and assertively rather than aggressively and defensively. Target Date: 2021-12-18 Frequency: every three weeks  Progress: 0 Modality: individual  Related Interventions Use role-playing, role reversal, modeling, and behavioral rehearsal to help the client develop assertive ways to resolve conflict with parents (recommend Your Perfect Right: Assertiveness and Equality in Your Life and Relationships by Neldon Newport). 13. Reduce overall frequency, intensity, and duration of the anxiety so that daily functioning is not impaired. 14. Resolve key life conflicts and the emotional stress that fuels obsessive-compulsive behavior patterns. Objective Accept or work to resolve identified life conflicts. Target Date: 2021-12-18 Frequency: every three weeks  Progress: 0 Modality: individual  Related Interventions Explore the resolution of identified interpersonal or other identified life conflicts; assist the client with acceptance of those that cannot be changed or use a conflict-resolution approach to address those that can. Objective Verbalize an accurate understanding of OCD, how it develops, and how it is maintained. Target Date: 2021-12-18 Frequency: every three weeks  Progress: 0 Modality: individual  Related Interventions Convey a biopsychosocial model for the development and maintenance of OCD highlighting the role of unwarranted fear and avoidance in its maintenance (see Mastery of Obsessive-Compulsive Disorder by Harland Dingwall). 15. Stabilize anxiety level while increasing ability to function on a daily basis  Diagnosis:Generalized anxiety disorder  Depression, major, single episode, mild ([place])  Plan:  -meet again on Monday, September 16, 2021 at [place], [facility]

## 2021-09-16 ENCOUNTER — Encounter: Payer: Self-pay | Admitting: Professional

## 2021-09-16 ENCOUNTER — Ambulatory Visit (INDEPENDENT_AMBULATORY_CARE_PROVIDER_SITE_OTHER): Payer: 59 | Admitting: Professional

## 2021-09-16 DIAGNOSIS — F411 Generalized anxiety disorder: Secondary | ICD-10-CM

## 2021-09-16 DIAGNOSIS — F320 Major depressive disorder, single episode, mild: Secondary | ICD-10-CM | POA: Diagnosis not present

## 2021-09-16 NOTE — Progress Notes (Signed)
[facility] Counselor/Therapist Progress Note  Patient ID: Katherine Michael, MRN: [id number],    Date: 09/16/2021  Time Spent: 39 minutes [id number]  Treatment Type: Individual Therapy  Risk Assessment: Danger to Self:  No Self-injurious Behavior: No Danger to Others: No Duty to Warn:no  Subjective:  This session was held via video teletherapy. The patient consented to video teletherapy and was located at her home during this session. She is aware it is the responsibility of the patient to secure confidentiality on her end of the session. The provider was in a private home office for the duration of this session.   The patient arrived on time for her webex session.  Issues addressed: 1-anxiety -pt in a rut regarding conception -patient tearful and crying -pt doesn't feel good enough to socialize   -wants to cancel dinner plans with friends -asked pt if she could be getting her period   -she admits that it does make her mental health worse -she doesn't understand why she can't get pregnant   -it is hard to live it day in and day out -she does not think she can handle tracking anymore because she is so preoccupied -strategies for coping -resources to seek support  Problems Addressed  Anxiety, Family Conflict, Low Self-Esteem, Obsessive-Compulsive Disorder (OCD)  Goals 1. Accept the presence of obsessive thoughts without acting on them and commit to a value-driven life. Objective Gain insight into how childhood experiences might influence current struggles with OCD and take appropriate actions. Target Date: 2021-12-18 Frequency: every three weeks  Progress: 0 Modality: individual  Objective Engage in a strategic ordeal to overcome OCD impulses. Target Date: 2021-12-18 Frequency: every three weeks  Progress: 0 Modality: individual  Objective Identify and discuss unresolved life conflicts. Target Date: 2021-12-18 Frequency: every three weeks  Progress: 0  Modality: individual  Related Interventions Explore the client's life circumstances to help identify key unresolved conflicts that may underlie OCD. Objective Keep a daily journal of obsessions, compulsions, and triggers; record thoughts, feelings, and actions taken. Target Date: 2021-12-18 Frequency: every three weeks  Progress: 0 Modality: individual  Related Interventions Ask the client to self-monitor obsessions, compulsions, and triggers; record thoughts, feelings, and actions taken; routinely process the data to facilitate the accomplishment of therapeutic objectives (or assign "Analyze the Probability of a Feared Event" in the Adult Psychotherapy Homework Planner by Stephannie LiJongsma). 2. Achieve a reasonable level of family connectedness and harmony where members support, help, and are concerned for each other. 3. Decrease the level of present conflict with parents while beginning to let go of or resolving past conflicts with them. 4. Demonstrate improved self-esteem through more pride in appearance, more assertiveness, greater eye contact, and identification of positive traits in self-talk messages. 5. Develop a consistent, positive self-image. 6. Elevate self-esteem. 7. Enhance ability to effectively cope with the full variety of life's worries and anxieties. 8. Establish an inward sense of self-worth, confidence, and competence. 9. Function daily at a consistent level with minimal interference from obsessions and compulsions. 10. Learn and implement coping skills that result in a reduction of anxiety and worry, and improved daily functioning. Objective Learn and implement problem-solving strategies for realistically addressing worries. Target Date: 2021-12-18 Frequency: every three weeks  Progress: 0 Modality: individual  Related Interventions Teach the client problem-solving strategies involving specifically defining a problem, generating options for addressing it, evaluating the pros and  cons of each option, selecting and implementing an optional action, and reevaluating and refining the action (or assign "Applying Problem-Solving  to Interpersonal Conflict" in the Adult Psychotherapy Homework Planner by [name]). Objective Identify the major life conflicts from the past and present that form the basis for present anxiety. Target Date: 2021-12-18 Frequency: every three weeks  Progress: 0 Modality: individual  Related Interventions Ask the client to develop and process a list of key past and present life conflicts that continue to cause worry. Assist the client in becoming aware of key unresolved life conflicts and in starting to work toward their resolution. Objective Learn to accept limitations in life and commit to tolerating, rather than avoiding, unpleasant emotions while accomplishing meaningful goals. Target Date: 2021-12-18 Frequency: every three weeks  Progress: 0 Modality: individual  Related Interventions Use techniques from Acceptance and Commitment Therapy to help client accept uncomfortable realities such as lack of complete control, imperfections, and uncertainty and tolerate unpleasant emotions and thoughts in order to accomplish value-consistent goals. Objective Identify and engage in pleasant activities on a daily basis. Target Date: 2021-12-18 Frequency: every three weeks  Progress: 0 Modality: individual  Related Interventions Engage the client in behavioral activation, increasing the client's contact with sources of reward, identifying processes that inhibit activation, and teaching skills to solve life problems (or assign "Identify and Schedule Pleasant Activities" in the Adult Psychotherapy Homework Planner by [name]); use behavioral techniques such as instruction, rehearsal, role-playing, role reversal as needed to assist adoption into the client's daily life; reinforce success. Objective Identify, challenge, and replace biased, fearful self-talk with  positive, realistic, and empowering self-talk. Target Date: 2021-12-18 Frequency: every three weeks  Progress: 0 Modality: individual  Related Interventions Assign the client a homework exercise in which he/she identifies fearful self-talk, identifies biases in the self-talk, generates alternatives, and tests through behavioral experiments (or assign "Negative Thoughts Trigger Negative Feelings" in the Adult Psychotherapy Homework Planner by [facility]); review and reinforce success, providing corrective feedback toward improvement. Explore the client's schema and self-talk that mediate his/her fear response; assist him/her in challenging the biases; replace the distorted messages with reality-based alternatives and positive, realistic self-talk that will increase his/her self-confidence in coping with irrational fears (see Cognitive Therapy of Anxiety Disorders by Laurence Slate). Objective Learn and implement a strategy to limit the association between various environmental settings and worry, delaying the worry until a designated "worry time." Target Date: 2021-12-18 Frequency: every three weeks  Progress: 0 Modality: individual  Related Interventions Teach the client how to recognize, stop, and postpone worry to the agreed upon worry time using skills such as thought stopping, relaxation, and redirecting attention (or assign "Making Use of the Thought-Stopping Technique" and/or "Worry Time" in the Adult Psychotherapy Homework Planner by Jongsma to assist skill development); encourage use in daily life; review and reinforce success while providing corrective feedback toward improvement. Explain the rationale for using a worry time as well as how it is to be used; agree upon and implement a worry time with the client. Objective Learn and implement calming skills to reduce overall anxiety and manage anxiety symptoms. Target Date: 2021-12-18 Frequency: every three weeks  Progress: 0 Modality: individual   Related Interventions Teach the client calming/relaxation skills (e.g., applied relaxation, progressive muscle relaxation, cue-controlled relaxation; mindful breathing; biofeedback) and how to discriminate better between relaxation and tension; teach the client how to apply these skills to his/her daily life (e.g., New Directions in Progressive Muscle Relaxation by Marcelyn Ditty, and Hazlett-Stevens; Treating Generalized Anxiety Disorder by Rygh and Ida Rogue). 11. Let go of key thoughts, beliefs, and past life events in order to maximize  time free from obsessions and compulsions. 12. Reach a level of reduced tension, increased satisfaction, and improved communication with family and/or other authority figures. Objective Increase the number of positive family interactions by planning activities. Target Date: 2021-12-18 Frequency: every three weeks  Progress: 0 Modality: individual  Related Interventions Assist the client in developing a list of positive family activities that promote harmony (e.g., bowling, fishing, playing table games, doing work projects). Schedule such activities into the family calendar. Objective Family members demonstrate increased openness by sharing thoughts and feelings about family dynamics, roles, and expectations. Target Date: 2021-12-18 Frequency: every three weeks  Progress: 0 Modality: individual  Objective Report an increase in resolving conflicts with parents by talking calmly and assertively rather than aggressively and defensively. Target Date: 2021-12-18 Frequency: every three weeks  Progress: 0 Modality: individual  Related Interventions Use role-playing, role reversal, modeling, and behavioral rehearsal to help the client develop assertive ways to resolve conflict with parents (recommend Your Perfect Right: Assertiveness and Equality in Your Life and Relationships by Carnella Guadalajara). 13. Reduce overall frequency, intensity, and duration of the  anxiety so that daily functioning is not impaired. 14. Resolve key life conflicts and the emotional stress that fuels obsessive-compulsive behavior patterns. Objective Accept or work to resolve identified life conflicts. Target Date: 2021-12-18 Frequency: every three weeks  Progress: 0 Modality: individual  Related Interventions Explore the resolution of identified interpersonal or other identified life conflicts; assist the client with acceptance of those that cannot be changed or use a conflict-resolution approach to address those that can. Objective Verbalize an accurate understanding of OCD, how it develops, and how it is maintained. Target Date: 2021-12-18 Frequency: every three weeks  Progress: 0 Modality: individual  Related Interventions Convey a biopsychosocial model for the development and maintenance of OCD highlighting the role of unwarranted fear and avoidance in its maintenance (see Mastery of Obsessive-Compulsive Disorder by Phebe Colla). 15. Stabilize anxiety level while increasing ability to function on a daily basis  Diagnosis:Generalized anxiety disorder  Depression, major, single episode, mild ([facility])  Plan:  -meet again on Friday, November 08, 2021 at 12pm.  Teofilo Pod, [facility]

## 2021-09-19 ENCOUNTER — Telehealth: Payer: Self-pay | Admitting: Family Medicine

## 2021-09-19 DIAGNOSIS — F320 Major depressive disorder, single episode, mild: Secondary | ICD-10-CM

## 2021-09-19 DIAGNOSIS — F429 Obsessive-compulsive disorder, unspecified: Secondary | ICD-10-CM

## 2021-09-19 MED ORDER — SERTRALINE HCL 100 MG PO TABS: ORAL_TABLET | ORAL | 0 refills | Status: DC

## 2021-09-19 NOTE — Telephone Encounter (Signed)
Meds ordered this encounter  Medications   sertraline (ZOLOFT) 100 MG tablet    Sig: TAKE 1 TABLET (100 MG) BY MOUTH DAILY    Dispense:  45 tablet    Refill:  0

## 2021-09-19 NOTE — Telephone Encounter (Signed)
Called to let patient know RX sent, phone was answered and immediately disconnected.

## 2021-09-19 NOTE — Telephone Encounter (Signed)
Patient is scheduled for an appointment on 7/13 but will be out of her zoloft before then. Can a refill be sent to get her until then. Please advise.

## 2021-09-20 ENCOUNTER — Other Ambulatory Visit: Payer: Self-pay | Admitting: Family Medicine

## 2021-09-20 DIAGNOSIS — F429 Obsessive-compulsive disorder, unspecified: Secondary | ICD-10-CM

## 2021-09-20 DIAGNOSIS — F320 Major depressive disorder, single episode, mild: Secondary | ICD-10-CM

## 2021-09-23 ENCOUNTER — Encounter: Payer: Self-pay | Admitting: Family Medicine

## 2021-09-23 ENCOUNTER — Other Ambulatory Visit: Payer: Self-pay | Admitting: Family Medicine

## 2021-09-23 DIAGNOSIS — F320 Major depressive disorder, single episode, mild: Secondary | ICD-10-CM

## 2021-09-23 DIAGNOSIS — F429 Obsessive-compulsive disorder, unspecified: Secondary | ICD-10-CM

## 2021-09-23 MED ORDER — SERTRALINE HCL 100 MG PO TABS: ORAL_TABLET | ORAL | 0 refills | Status: DC

## 2021-09-23 NOTE — Telephone Encounter (Signed)
Patient advised.

## 2021-10-24 ENCOUNTER — Encounter: Payer: Self-pay | Admitting: Family Medicine

## 2021-10-24 ENCOUNTER — Ambulatory Visit (INDEPENDENT_AMBULATORY_CARE_PROVIDER_SITE_OTHER): Payer: Managed Care, Other (non HMO) | Admitting: Family Medicine

## 2021-10-24 VITALS — BP 96/53 | HR 70

## 2021-10-24 DIAGNOSIS — F429 Obsessive-compulsive disorder, unspecified: Secondary | ICD-10-CM

## 2021-10-24 DIAGNOSIS — F320 Major depressive disorder, single episode, mild: Secondary | ICD-10-CM

## 2021-10-24 DIAGNOSIS — F411 Generalized anxiety disorder: Secondary | ICD-10-CM

## 2021-10-24 MED ORDER — SERTRALINE HCL 100 MG PO TABS: ORAL_TABLET | ORAL | 1 refills | Status: DC

## 2021-10-24 NOTE — Assessment & Plan Note (Addendum)
PHQ-9 score of 2 and GAD-7 score of 4.  Overall she is at a therapeutic level and is happy with the 100 mg dose.  OB/GYN is also okay with her current regimen.  Encouraged her to continue to stay physically active she does workout regularly.  She says at some point she would like to consider being able to decrease her dose and if we do that I would certainly make a very minor change down to 75 mg for example instead of 50.  But she will let me know.  Otherwise I will see her back in 6 months.  Continue counseling.

## 2021-10-24 NOTE — Progress Notes (Signed)
   Established Patient Office Visit  Subjective   Patient ID: Katherine Michael, female    DOB: [birth year]  Age: 26 y.o. MRN: [id number]  No chief complaint on file.   HPI  Follow-up depression/anxiety-currently on sertraline.  Feels like overall she is actually doing well on the medication she did drop back down to 100 mg daily and feels like that is been most effective.  She is now [redacted] weeks pregnant and doing well she did speak with her OB about it and they were okay with her continuing the medication.  She still working with Olegario Messier her therapist.  She was recently started on some vitamin B6 and Unisom for nausea that has been helpful.  She still notices an increase in anxiety around her periods the right now that she is pregnant she is not having those so she still making some adjustments.  She still running for exercise.    ROS    Objective:     BP (!) 96/53   Pulse 70   LMP  (LMP Unknown)    Physical Exam Vitals and nursing note reviewed.  Constitutional:      Appearance: She is well-developed.  HENT:     Head: Normocephalic and atraumatic.  Cardiovascular:     Rate and Rhythm: Normal rate and regular rhythm.     Heart sounds: Normal heart sounds.  Pulmonary:     Effort: Pulmonary effort is normal.     Breath sounds: Normal breath sounds.  Skin:    General: Skin is warm and dry.  Neurological:     Mental Status: She is alert and oriented to person, place, and time.  Psychiatric:        Behavior: Behavior normal.      No results found for any visits on 10/24/21.    The ASCVD Risk score (Arnett DK, et al., 2019) failed to calculate for the following reasons:   The 2019 ASCVD risk score is only valid for ages 67 to 17    Assessment & Plan:   Problem List Items Addressed This Visit       Other   Obsessive-compulsive disorder   Relevant Medications   sertraline (ZOLOFT) 100 MG tablet   Generalized anxiety disorder   Relevant Medications   sertraline (ZOLOFT)  100 MG tablet   Depression, major, single episode, mild (HCC) - Primary    PHQ-9 score of 2 and GAD-7 score of 4.  Overall she is at a therapeutic level and is happy with the 100 mg dose.  OB/GYN is also okay with her current regimen.  Encouraged her to continue to stay physically active she does workout regularly.  She says at some point she would like to consider being able to decrease her dose and if we do that I would certainly make a very minor change down to 75 mg for example instead of 50.  But she will let me know.  Otherwise I will see her back in 6 months.  Continue counseling.      Relevant Medications   sertraline (ZOLOFT) 100 MG tablet    Return in about 6 months (around 04/26/2022) for Mood.    Nani Gasser, MD

## 2021-11-05 ENCOUNTER — Other Ambulatory Visit: Payer: Self-pay | Admitting: Family Medicine

## 2021-11-05 DIAGNOSIS — F320 Major depressive disorder, single episode, mild: Secondary | ICD-10-CM

## 2021-11-05 DIAGNOSIS — F429 Obsessive-compulsive disorder, unspecified: Secondary | ICD-10-CM

## 2021-11-08 ENCOUNTER — Ambulatory Visit (INDEPENDENT_AMBULATORY_CARE_PROVIDER_SITE_OTHER): Payer: 59 | Admitting: Professional

## 2021-11-08 ENCOUNTER — Encounter: Payer: Self-pay | Admitting: Professional

## 2021-11-08 DIAGNOSIS — F320 Major depressive disorder, single episode, mild: Secondary | ICD-10-CM

## 2021-11-08 DIAGNOSIS — F411 Generalized anxiety disorder: Secondary | ICD-10-CM

## 2021-11-08 NOTE — Progress Notes (Signed)
Behavioral Health Counselor/Therapist Progress Note  Patient ID: Katherine Michael, MRN: [id number],    Date: 11/08/2021  Time Spent: 40 minutes 1004-1044am  Treatment Type: Individual Therapy  Risk Assessment: Danger to Self:  No Self-injurious Behavior: No Danger to Others: No Duty to Warn:no  Subjective:  This session was held via video teletherapy. The patient consented to video teletherapy and was located at her home during this session. She is aware it is the responsibility of the patient to secure confidentiality on her end of the session. The provider was in a private home office for the duration of this session.   The patient arrived on time for her webex session.  Issues addressed: 1-pt is pregnant and found out on September 20, 2021 -she is due in late February 2024 -she has been to [place] and everything is good -her mother and father-in-law are supportive -they wanted to tell his sister and her husband in person   -her parent's were pressuring them to tell   -they were out of town at an event     -her brother-in-law Katherine Michael had gotten a promotion     -Katherine Michael said how would you feel promoted to aunt and uncle       -his sister Katherine Michael left the camera and fell on the floor hysterically sobbing during a public event       -Katherine Michael had found out the same day that her husband's sister was pregnant   -Katherine Michael was happy for her -Katherine Michael called her mother the next morning and said it was a direct slap in the face   -her mother Katherine Michael did not respond to Katherine Michael's comments   -Katherine Michael then texted her mother and chewed her out for having been a terrible mother toward her -Katherine Michael contacted her and asked her to call   -pt texted her and said she did not have the energy to hash out her issues   -Katherine Michael then texted her back and blasted her   -Katherine Michael tried to call his sister and she blocked him   -Katherine Michael called Katherine Michael and said to get his sister on the phone     -Katherine Michael told them there would be no  contact due to the way she has acted Press photographer conflict -discussed what has worked in past and what she could do moving forward -pt relies heavily on her spiritual beliefs   -acknowledges her need to trust God   -acknowledges her need to lean on him and be more christ-like -pt has done nothing wrong so consider continuing business as usual -learning to love your sister-in-law even when she is not likeable  Problems Addressed  Anxiety, Family Conflict, Low Self-Esteem, Obsessive-Compulsive Disorder (OCD)  Goals 1. Accept the presence of obsessive thoughts without acting on them and commit to a value-driven life. Objective Gain insight into how childhood experiences might influence current struggles with OCD and take appropriate actions. Target Date: 2021-12-18 Frequency: every three weeks  Progress: 0 Modality: individual  Objective Engage in a strategic ordeal to overcome OCD impulses. Target Date: 2021-12-18 Frequency: every three weeks  Progress: 0 Modality: individual  Objective Identify and discuss unresolved life conflicts. Target Date: 2021-12-18 Frequency: every three weeks  Progress: 0 Modality: individual  Related Interventions Explore the client's life circumstances to help identify key unresolved conflicts that may underlie OCD. Objective Keep a daily journal of obsessions, compulsions, and triggers; record thoughts, feelings, and actions taken. Target Date: 2021-12-18 Frequency: every three weeks  Progress: 0 Modality: individual  Related Interventions Ask the client to self-monitor obsessions, compulsions, and triggers; record thoughts, feelings, and actions taken; routinely process the data to facilitate the accomplishment of therapeutic objectives (or assign "Analyze the Probability of a Feared Event" in the Adult Psychotherapy Homework Planner by [name]). 2. Achieve a reasonable level of family connectedness and harmony where members support, help, and are concerned  for each other. 3. Decrease the level of present conflict with parents while beginning to let go of or resolving past conflicts with them. 4. Demonstrate improved self-esteem through more pride in appearance, more assertiveness, greater eye contact, and identification of positive traits in self-talk messages. 5. Develop a consistent, positive self-image. 6. Elevate self-esteem. 7. Enhance ability to effectively cope with the full variety of life's worries and anxieties. 8. Establish an inward sense of self-worth, confidence, and competence. 9. Function daily at a consistent level with minimal interference from obsessions and compulsions. 10. Learn and implement coping skills that result in a reduction of anxiety and worry, and improved daily functioning. Objective Learn and implement problem-solving strategies for realistically addressing worries. Target Date: 2021-12-18 Frequency: every three weeks  Progress: 0 Modality: individual  Related Interventions Teach the client problem-solving strategies involving specifically defining a problem, generating options for addressing it, evaluating the pros and cons of each option, selecting and implementing an optional action, and reevaluating and refining the action (or assign "Applying Problem-Solving to Interpersonal Conflict" in the Adult Psychotherapy Homework Planner by [name]). Objective Identify the major life conflicts from the past and present that form the basis for present anxiety. Target Date: 2021-12-18 Frequency: every three weeks  Progress: 0 Modality: individual  Related Interventions Ask the client to develop and process a list of key past and present life conflicts that continue to cause worry. Assist the client in becoming aware of key unresolved life conflicts and in starting to work toward their resolution. Objective Learn to accept limitations in life and commit to tolerating, rather than avoiding, unpleasant emotions while  accomplishing meaningful goals. Target Date: 2021-12-18 Frequency: every three weeks  Progress: 0 Modality: individual  Related Interventions Use techniques from Acceptance and Commitment Therapy to help client accept uncomfortable realities such as lack of complete control, imperfections, and uncertainty and tolerate unpleasant emotions and thoughts in order to accomplish value-consistent goals. Objective Identify and engage in pleasant activities on a daily basis. Target Date: 2021-12-18 Frequency: every three weeks  Progress: 0 Modality: individual  Related Interventions Engage the client in behavioral activation, increasing the client's contact with sources of reward, identifying processes that inhibit activation, and teaching skills to solve life problems (or assign "Identify and Schedule Pleasant Activities" in the Adult Psychotherapy Homework Planner by [name]); use behavioral techniques such as instruction, rehearsal, role-playing, role reversal as needed to assist adoption into the client's daily life; reinforce success. Objective Identify, challenge, and replace biased, fearful self-talk with positive, realistic, and empowering self-talk. Target Date: 2021-12-18 Frequency: every three weeks  Progress: 0 Modality: individual  Related Interventions Assign the client a homework exercise in which he/she identifies fearful self-talk, identifies biases in the self-talk, generates alternatives, and tests through behavioral experiments (or assign "Negative Thoughts Trigger Negative Feelings" in the Adult Psychotherapy Homework Planner by [facility]); review and reinforce success, providing corrective feedback toward improvement. Explore the client's schema and self-talk that mediate his/her fear response; assist him/her in challenging the biases; replace the distorted messages with reality-based alternatives and positive, realistic self-talk that will increase his/her self-confidence in coping with  irrational fears (see  Cognitive Therapy of Anxiety Disorders by Laurence Slate). Objective Learn and implement a strategy to limit the association between various environmental settings and worry, delaying the worry until a designated "worry time." Target Date: 2021-12-18 Frequency: every three weeks  Progress: 0 Modality: individual  Related Interventions Teach the client how to recognize, stop, and postpone worry to the agreed upon worry time using skills such as thought stopping, relaxation, and redirecting attention (or assign "Making Use of the Thought-Stopping Technique" and/or "Worry Time" in the Adult Psychotherapy Homework Planner by Jongsma to assist skill development); encourage use in daily life; review and reinforce success while providing corrective feedback toward improvement. Explain the rationale for using a worry time as well as how it is to be used; agree upon and implement a worry time with the client. Objective Learn and implement calming skills to reduce overall anxiety and manage anxiety symptoms. Target Date: 2021-12-18 Frequency: every three weeks  Progress: 0 Modality: individual  Related Interventions Teach the client calming/relaxation skills (e.g., applied relaxation, progressive muscle relaxation, cue-controlled relaxation; mindful breathing; biofeedback) and how to discriminate better between relaxation and tension; teach the client how to apply these skills to his/her daily life (e.g., New Directions in Progressive Muscle Relaxation by Marcelyn Ditty, and Hazlett-Stevens; Treating Generalized Anxiety Disorder by Rygh and Ida Rogue). 11. Let go of key thoughts, beliefs, and past life events in order to maximize time free from obsessions and compulsions. 12. Reach a level of reduced tension, increased satisfaction, and improved communication with family and/or other authority figures. Objective Increase the number of positive family interactions by planning  activities. Target Date: 2021-12-18 Frequency: every three weeks  Progress: 0 Modality: individual  Related Interventions Assist the client in developing a list of positive family activities that promote harmony (e.g., bowling, fishing, playing table games, doing work projects). Schedule such activities into the family calendar. Objective Family members demonstrate increased openness by sharing thoughts and feelings about family dynamics, roles, and expectations. Target Date: 2021-12-18 Frequency: every three weeks  Progress: 0 Modality: individual  Objective Report an increase in resolving conflicts with parents by talking calmly and assertively rather than aggressively and defensively. Target Date: 2021-12-18 Frequency: every three weeks  Progress: 0 Modality: individual  Related Interventions Use role-playing, role reversal, modeling, and behavioral rehearsal to help the client develop assertive ways to resolve conflict with parents (recommend Your Perfect Right: Assertiveness and Equality in Your Life and Relationships by Carnella Guadalajara). 13. Reduce overall frequency, intensity, and duration of the anxiety so that daily functioning is not impaired. 14. Resolve key life conflicts and the emotional stress that fuels obsessive-compulsive behavior patterns. Objective Accept or work to resolve identified life conflicts. Target Date: 2021-12-18 Frequency: every three weeks  Progress: 0 Modality: individual  Related Interventions Explore the resolution of identified interpersonal or other identified life conflicts; assist the client with acceptance of those that cannot be changed or use a conflict-resolution approach to address those that can. Objective Verbalize an accurate understanding of OCD, how it develops, and how it is maintained. Target Date: 2021-12-18 Frequency: every three weeks  Progress: 0 Modality: individual  Related Interventions Convey a biopsychosocial model for the  development and maintenance of OCD highlighting the role of unwarranted fear and avoidance in its maintenance (see Mastery of Obsessive-Compulsive Disorder by Phebe Colla). 15. Stabilize anxiety level while increasing ability to function on a daily basis  Diagnosis:Generalized anxiety disorder  Depression, major, single episode, mild (HCC)  Plan:  -meet again on  Monday, December 02, 2021 at 9am.  Teofilo Pod, [facility]

## 2021-12-02 ENCOUNTER — Encounter: Payer: Self-pay | Admitting: Professional

## 2021-12-02 ENCOUNTER — Ambulatory Visit (INDEPENDENT_AMBULATORY_CARE_PROVIDER_SITE_OTHER): Payer: 59 | Admitting: Professional

## 2021-12-02 DIAGNOSIS — F411 Generalized anxiety disorder: Secondary | ICD-10-CM | POA: Diagnosis not present

## 2021-12-02 DIAGNOSIS — F320 Major depressive disorder, single episode, mild: Secondary | ICD-10-CM

## 2021-12-02 NOTE — Progress Notes (Signed)
[facility] Counselor/Therapist Progress Note  Patient ID: Katherine Michael, MRN: [id number],    Date: 12/02/2021  Time Spent: 43 minutes 902-945am  Treatment Type: Individual Therapy  Risk Assessment: Danger to Self:  No Self-injurious Behavior: No Danger to Others: No Duty to Warn:no  Subjective:  This session was held via video teletherapy. The patient consented to video teletherapy and was located at her home during this session. She is aware it is the responsibility of the patient to secure confidentiality on her end of the session. The provider was in a private home office for the duration of this session.   The patient arrived on time for her webex session.  Issues addressed: 1-gender reveal yesterday -pt and her spouse will be having a boy and are very excited -sister-in-law (SIL) and brother were not invited -her mother-in-law (MIL) is disappointed about the break in relationship 2-managing conflict -pt is so tired of the conflict with her SIL -she would like for her SIL to wake up and make positive changes -pt thinks SIL has no insight and it is something that they are stuck in -pt admits a lot of unresolved anger on her part toward her SIL   -admits she can become bitter   -she knows she needs to pray to forgive 3-boundaries   -educated: healthy vs. unhealthy   -examples 4-emotionally  -week by week -overall good 5-physically -better than she was -has entered second trimester  Problems Addressed  Anxiety, Family Conflict, Low Self-Esteem, Obsessive-Compulsive Disorder (OCD)  Goals 1. Accept the presence of obsessive thoughts without acting on them and commit to a value-driven life. Objective Gain insight into how childhood experiences might influence current struggles with OCD and take appropriate actions. Target Date: 2021-12-18 Frequency: every three weeks  Progress: 0 Modality: individual  Objective Engage in a strategic ordeal to overcome  OCD impulses. Target Date: 2021-12-18 Frequency: every three weeks  Progress: 0 Modality: individual  Objective Identify and discuss unresolved life conflicts. Target Date: 2021-12-18 Frequency: every three weeks  Progress: 0 Modality: individual  Related Interventions Explore the client's life circumstances to help identify key unresolved conflicts that may underlie OCD. Objective Keep a daily journal of obsessions, compulsions, and triggers; record thoughts, feelings, and actions taken. Target Date: 2021-12-18 Frequency: every three weeks  Progress: 0 Modality: individual  Related Interventions Ask the client to self-monitor obsessions, compulsions, and triggers; record thoughts, feelings, and actions taken; routinely process the data to facilitate the accomplishment of therapeutic objectives (or assign "Analyze the Probability of a Feared Event" in the Adult Psychotherapy Homework Planner by [name]). 2. Achieve a reasonable level of family connectedness and harmony where members support, help, and are concerned for each other. 3. Decrease the level of present conflict with parents while beginning to let go of or resolving past conflicts with them. 4. Demonstrate improved self-esteem through more pride in appearance, more assertiveness, greater eye contact, and identification of positive traits in self-talk messages. 5. Develop a consistent, positive self-image. 6. Elevate self-esteem. 7. Enhance ability to effectively cope with the full variety of life's worries and anxieties. 8. Establish an inward sense of self-worth, confidence, and competence. 9. Function daily at a consistent level with minimal interference from obsessions and compulsions. 10. Learn and implement coping skills that result in a reduction of anxiety and worry, and improved daily functioning. Objective Learn and implement problem-solving strategies for realistically addressing worries. Target Date: 2021-12-18  Frequency: every three weeks  Progress: 0 Modality: individual  Related Interventions  Teach the client problem-solving strategies involving specifically defining a problem, generating options for addressing it, evaluating the pros and cons of each option, selecting and implementing an optional action, and reevaluating and refining the action (or assign "Applying Problem-Solving to Interpersonal Conflict" in the Adult Psychotherapy Homework Planner by [name]). Objective Identify the major life conflicts from the past and present that form the basis for present anxiety. Target Date: 2021-12-18 Frequency: every three weeks  Progress: 0 Modality: individual  Related Interventions Ask the client to develop and process a list of key past and present life conflicts that continue to cause worry. Assist the client in becoming aware of key unresolved life conflicts and in starting to work toward their resolution. Objective Learn to accept limitations in life and commit to tolerating, rather than avoiding, unpleasant emotions while accomplishing meaningful goals. Target Date: 2021-12-18 Frequency: every three weeks  Progress: 0 Modality: individual  Related Interventions Use techniques from Acceptance and Commitment Therapy to help client accept uncomfortable realities such as lack of complete control, imperfections, and uncertainty and tolerate unpleasant emotions and thoughts in order to accomplish value-consistent goals. Objective Identify and engage in pleasant activities on a daily basis. Target Date: 2021-12-18 Frequency: every three weeks  Progress: 0 Modality: individual  Related Interventions Engage the client in behavioral activation, increasing the client's contact with sources of reward, identifying processes that inhibit activation, and teaching skills to solve life problems (or assign "Identify and Schedule Pleasant Activities" in the Adult Psychotherapy Homework Planner by [name]); use  behavioral techniques such as instruction, rehearsal, role-playing, role reversal as needed to assist adoption into the client's daily life; reinforce success. Objective Identify, challenge, and replace biased, fearful self-talk with positive, realistic, and empowering self-talk. Target Date: 2021-12-18 Frequency: every three weeks  Progress: 0 Modality: individual  Related Interventions Assign the client a homework exercise in which he/she identifies fearful self-talk, identifies biases in the self-talk, generates alternatives, and tests through behavioral experiments (or assign "Negative Thoughts Trigger Negative Feelings" in the Adult Psychotherapy Homework Planner by [facility]); review and reinforce success, providing corrective feedback toward improvement. Explore the client's schema and self-talk that mediate his/her fear response; assist him/her in challenging the biases; replace the distorted messages with reality-based alternatives and positive, realistic self-talk that will increase his/her self-confidence in coping with irrational fears (see Cognitive Therapy of Anxiety Disorders by Laurence Slate). Objective Learn and implement a strategy to limit the association between various environmental settings and worry, delaying the worry until a designated "worry time." Target Date: 2021-12-18 Frequency: every three weeks  Progress: 0 Modality: individual  Related Interventions Teach the client how to recognize, stop, and postpone worry to the agreed upon worry time using skills such as thought stopping, relaxation, and redirecting attention (or assign "Making Use of the Thought-Stopping Technique" and/or "Worry Time" in the Adult Psychotherapy Homework Planner by Jongsma to assist skill development); encourage use in daily life; review and reinforce success while providing corrective feedback toward improvement. Explain the rationale for using a worry time as well as how it is to be used; agree  upon and implement a worry time with the client. Objective Learn and implement calming skills to reduce overall anxiety and manage anxiety symptoms. Target Date: 2021-12-18 Frequency: every three weeks  Progress: 0 Modality: individual  Related Interventions Teach the client calming/relaxation skills (e.g., applied relaxation, progressive muscle relaxation, cue-controlled relaxation; mindful breathing; biofeedback) and how to discriminate better between relaxation and tension; teach the client how to apply these skills  to his/her daily life (e.g., New Directions in Progressive Muscle Relaxation by Marcelyn Ditty, and Hazlett-Stevens; Treating Generalized Anxiety Disorder by Rygh and Ida Rogue). 11. Let go of key thoughts, beliefs, and past life events in order to maximize time free from obsessions and compulsions. 12. Reach a level of reduced tension, increased satisfaction, and improved communication with family and/or other authority figures. Objective Increase the number of positive family interactions by planning activities. Target Date: 2021-12-18 Frequency: every three weeks  Progress: 0 Modality: individual  Related Interventions Assist the client in developing a list of positive family activities that promote harmony (e.g., bowling, fishing, playing table games, doing work projects). Schedule such activities into the family calendar. Objective Family members demonstrate increased openness by sharing thoughts and feelings about family dynamics, roles, and expectations. Target Date: 2021-12-18 Frequency: every three weeks  Progress: 0 Modality: individual  Objective Report an increase in resolving conflicts with parents by talking calmly and assertively rather than aggressively and defensively. Target Date: 2021-12-18 Frequency: every three weeks  Progress: 0 Modality: individual  Related Interventions Use role-playing, role reversal, modeling, and behavioral rehearsal to help the  client develop assertive ways to resolve conflict with parents (recommend Your Perfect Right: Assertiveness and Equality in Your Life and Relationships by Carnella Guadalajara). 13. Reduce overall frequency, intensity, and duration of the anxiety so that daily functioning is not impaired. 14. Resolve key life conflicts and the emotional stress that fuels obsessive-compulsive behavior patterns. Objective Accept or work to resolve identified life conflicts. Target Date: 2021-12-18 Frequency: every three weeks  Progress: 0 Modality: individual  Related Interventions Explore the resolution of identified interpersonal or other identified life conflicts; assist the client with acceptance of those that cannot be changed or use a conflict-resolution approach to address those that can. Objective Verbalize an accurate understanding of OCD, how it develops, and how it is maintained. Target Date: 2021-12-18 Frequency: every three weeks  Progress: 0 Modality: individual  Related Interventions Convey a biopsychosocial model for the development and maintenance of OCD highlighting the role of unwarranted fear and avoidance in its maintenance (see Mastery of Obsessive-Compulsive Disorder by Phebe Colla). 15. Stabilize anxiety level while increasing ability to function on a daily basis  Diagnosis:Generalized anxiety disorder  Depression, major, single episode, mild (HCC)  Plan:  -meet again on Monday, January 06, 2022 at 10am.  Teofilo Pod, [facility]

## 2022-01-06 ENCOUNTER — Ambulatory Visit (INDEPENDENT_AMBULATORY_CARE_PROVIDER_SITE_OTHER): Payer: 59 | Admitting: Professional

## 2022-01-06 ENCOUNTER — Encounter: Payer: Self-pay | Admitting: Professional

## 2022-01-06 DIAGNOSIS — F411 Generalized anxiety disorder: Secondary | ICD-10-CM

## 2022-01-06 DIAGNOSIS — F429 Obsessive-compulsive disorder, unspecified: Secondary | ICD-10-CM

## 2022-01-06 DIAGNOSIS — F320 Major depressive disorder, single episode, mild: Secondary | ICD-10-CM

## 2022-01-06 NOTE — Progress Notes (Signed)
[facility] Counselor/Therapist Progress Note  Patient ID: Katherine Michael, MRN: [id number],    Date: 01/06/2022  Time Spent: 43 minutes 902-945am  Treatment Type: Individual Therapy  Risk Assessment: Danger to Self:  No Self-injurious Behavior: No Danger to Others: No Duty to Warn:no  Subjective:  This session was held via video teletherapy. The patient consented to video teletherapy and was located at her home during this session. She is aware it is the responsibility of the patient to secure confidentiality on her end of the session. The provider was in a private home office for the duration of this session.   The patient arrived on time for her webex session.  Issues addressed: 1-pregnancy -pt feeling well -things are good in her pregnancy -she is bothered by the amount of suggestions 2-sister-in-law -no contact and does not plan to initiate 3-reviewed treatment plan -pt has accomplished majority of goals 4-treatment planning -patient and Clinician completed the yearly update -patient fully participated in and agrees with her treatment plan  2023 Problems Addressed  Anxiety, Depression, Low Self-Esteem/Lack of Assertiveness  Goals 1. Address and eliminate maladaptive thought processes that lead to anxious responses. 2. Enhance ability to handle effectively life stressors. 3. Identify and increase intrapersonal, interpersonal, and physical resources to foster positive coping strategies. 4. Improve depressed mood to maximize effective social, occupational, and physical functioning. Objective Complete psychological testing to understand the severity of depressive symptoms. Target Date: 2023-01-06 Frequency: Monthly  Progress: 0 Modality: individual  Objective Articulate signs and symptoms of depression in current life experiences. Target Date: 2023-01-06 Frequency: Monthly  Progress: 0 Modality: individual  Related Interventions Encourage the client to  share her feelings of depression to gain an insight into precipitating events and implications of symptoms; normalize her feelings of depression. Evaluate historical influences on current depressive symptoms, including previous episodes of depression, treatment history, and family history. Objective Identify precipitating events and factors of depression, particularly roles of self and others in depression. Target Date: 2023-01-06 Frequency: Monthly  Progress: 0 Modality: individual  Objective Increase the frequency of engaging in pleasant activities. Target Date: 2023-01-06 Frequency: Monthly  Progress: 0 Modality: individual  Related Interventions Assign the client reading materials regarding overcoming depression for women (e.g., Women and Depression: A Practical Self-Help Guide by Baird Cancer; Feeling Good by Quay Burow; Women & Depression by Aubery Lapping; Silencing the Self: Women and Depression by Barnabas Lister). Objective Implement assertiveness to communicate needs and desires to others. Target Date: 2023-01-06 Frequency: Monthly  Progress: 0 Modality: individual  Related Interventions Offer the client booster sessions as necessary. Objective Articulate self-care methods to prevent and/or manage depression in the future; create a list of 10 coping methods for future use. Target Date: 2023-01-06 Frequency: Monthly  Progress: 0 Modality: individual  Objective Articulate counseling goals as well as additional treatment needs focused on reducing depression. Target Date: 2023-01-06 Frequency: Monthly  Progress: 0 Modality: individual  Related Interventions Assist the client in developing a list of negative messages about women that have reinforced her symptoms of depression; request that the client outline her behaviors and feelings associated with the negative messages. Objective Implement behavioral interventions to overcome depression. Target Date: 2023-01-06 Frequency: Monthly  Progress: 0 Modality:  individual  Related Interventions Assist the client to consider upcoming stressors; brainstorm with her 10 ways to mitigate depression in the future. 5. Increase ability to express needs and desires openly and honestly. Objective Verbalize an understanding of the differences between passive, assertive, and aggressive behavior. Target Date: 2023-01-06 Frequency: Monthly  Progress: 0 Modality: individual  Related Interventions Encourage participation in physical activities that foster confidence and well-being (e.g., yoga, meditation, martial arts). Assist the client in identifying intrapersonal and interpersonal obstacles to self-esteem (e.g., self-deprecating comments, perfectionism, unhealthy relationships). Objective Identify three anxiety-related fears associated with being assertive. Target Date: 2023-01-06 Frequency: Monthly  Progress: 0 Modality: individual  Related Interventions Encourage the client to keep a journal in which she writes down her positive experiences and tracks her progress in building her self-esteem. Explore with the client the messages she received growing up regarding appropriate behavior for girls and women and the degree to which she internalized these messages. Objective Recall situations in which beliefs and feelings were asserted. Target Date: 2023-01-06 Frequency: Monthly  Progress: 0 Modality: individual  Objective Identify and strengthen connections to others who affirm self-esteem and respect one's assertiveness. Target Date: 2023-01-06 Frequency: Monthly  Progress: 0 Modality: individual  Objective Practice saying "no" to at least one person, declining to comply with a request/favor or identify and assert rights, needs, and wants. Target Date: 2023-01-06 Frequency: Monthly  Progress: 0 Modality: individual  6. Increase assertiveness skills and ability to advocate for self. 7. Increase involvement in activities that foster confidence and a sense of  accomplishment. 8. Increase overall sense of well-being via reduction/elimination of anxiety. Objective Verbalize anxiety symptoms as well as when, where, and how frequently they occur; describe attempts to resolve anxiety. Target Date: 2023-01-06 Frequency: Monthly  Progress: 0 Modality: individual  Related Interventions Explore with the client her current understanding of the external and internal factors that contribute to her anxiety (e.g., gender roles, social relationships/lack of support, socioeconomic status, trauma, brain chemistry, and hormones). Objective Implement self-care strategies that serve to augment the positive effects of other interventions. Target Date: 2023-01-06 Frequency: Monthly  Progress: 0 Modality: individual  Related Interventions Elicit the client's specific patterns of thought that contribute to anxious states; aid client in understanding the connection between the identified maladaptive thoughts and anxious feelings. Objective Identify precipitating events, thoughts, feelings, and reactions that are believed to contribute to anxiety. Target Date: 2023-01-06 Frequency: Monthly  Progress: 0 Modality: individual  Related Interventions Educate the client on the relaxing, calming, and balancing benefits of practices such as yoga, meditation, and prayer; provide suggestions on how to get started (e.g., book, video, local gym class). Objective Learn and practice methods of reducing anxiety in a variety of situations. Target Date: 2023-01-06 Frequency: Monthly  Progress: 0 Modality: individual  Related Interventions Educate the client on thought-stopping techniques and positive reframing in order to preempt anxious reactions. Encourage the use of positive self-talk as a replacement to some of the automatic, negative self-talk being utilized currently. Objective Acknowledge underlying irrational or illogical thought patterns that contribute to anxiety. Target  Date: 2023-01-06 Frequency: Monthly  Progress: 0 Modality: individual  Related Interventions Aid the client in seeing the connection between a focus on helping others or improving society and a reduction in anxiety (i.e., dwelling on maladaptive thoughts). Objective Self-correct maladaptive thought patterns preemptively in order to lessen or eliminate anxiety at least 90% of the time; increase positive self-talk. Target Date: 2023-01-06 Frequency: Monthly  Progress: 0 Modality: individual  Related Interventions Formulate with the client at least three possible solutions to resisting expectations that are limiting and anxiety producing. Collaborate with the client to determine what kind of changes need to be made, internally and externally, for calmness and satisfaction to exist within the arenas of work, home, and school. Devise a plan, together,  to allow the client to end toxic relationships and save those that she believes are worth salvaging; role-play assertive and effective interactions with others. Objective Verbalize the degree to which current life circumstances could be improved in areas such as work, home, and school. Target Date: 2023-01-06 Frequency: Monthly  Progress: 0 Modality: individual  Objective Retake the objective measure of anxiety that was administered at the beginning of therapy; discuss the extent to which anxiety symptoms have been reduced in severity and duration since beginning therapy. Target Date: 2023-01-06 Frequency: Monthly  Progress: 0 Modality: individual  Objective Identify the relationship between multiple roles, role overload, role strain, and anxiety reactions. Target Date: 2023-01-06 Frequency: Monthly  Progress: 0 Modality: individual  Related Interventions Compare current test results to baseline scores in order to reevaluate occurrence and severity of anxiety symptoms and treatment effectiveness; review results and elicit further feedback from  client regarding treatment efficacy.  2022 Treatment Plan Problems Addressed  Anxiety, Family Conflict, Low Self-Esteem, Obsessive-Compulsive Disorder (OCD)  Goals 1. Accept the presence of obsessive thoughts without acting on them and commit to a value-driven life. Objective Gain insight into how childhood experiences might influence current struggles with OCD and take appropriate actions. Target Date: 2021-12-18 Frequency: every three weeks  Progress: 85 Modality: individual  Objective Engage in a strategic ordeal to overcome OCD impulses. Target Date: 2021-12-18 Frequency: every three weeks  Progress: 90 Modality: individual  Objective Identify and discuss unresolved life conflicts. Target Date: 2021-12-18 Frequency: every three weeks  Progress: 80 Modality: individual  Related Interventions Explore the client's life circumstances to help identify key unresolved conflicts that may underlie OCD. Objective Keep a weekly journal of obsessions, compulsions, and triggers; record thoughts, feelings, and actions taken. Target Date: 2021-12-18 Frequency: every three weeks  Progress: 50 Modality: individual  Related Interventions Ask the client to self-monitor obsessions, compulsions, and triggers; record thoughts, feelings, and actions taken; routinely process the data to facilitate the accomplishment of therapeutic objectives (or assign "Analyze the Probability of a Feared Event" in the Adult Psychotherapy Homework Planner by Bryn Gulling). 2. Achieve a reasonable level of family connectedness and harmony where members support, help, and are concerned for each other. 3. Decrease the level of present conflict with parents while beginning to let go of or resolving past conflicts with them. 4. Demonstrate improved self-esteem through more pride in appearance, more assertiveness, greater eye contact, and identification of positive traits in self-talk messages. 5. Develop a consistent, positive  self-image. 6. Elevate self-esteem. 7. Enhance ability to effectively cope with the full variety of life's worries and anxieties. 8. Establish an inward sense of self-worth, confidence, and competence. 9. Function daily at a consistent level with minimal interference from obsessions and compulsions. 10. Learn and implement coping skills that result in a reduction of anxiety and worry, and improved daily functioning. Objective Learn and implement problem-solving strategies for realistically addressing worries. Target Date: 2021-12-18 Frequency: every three weeks  Progress: 90 Modality: individual  Related Interventions Teach the client problem-solving strategies involving specifically defining a problem, generating options for addressing it, evaluating the pros and cons of each option, selecting and implementing an optional action, and reevaluating and refining the action (or assign "Applying Problem-Solving to Interpersonal Conflict" in the Adult Psychotherapy Homework Planner by Bryn Gulling). Objective Identify the major life conflicts from the past and present that form the basis for present anxiety. Target Date: 2021-12-18 Frequency: every three weeks  Progress: 90 Modality: individual  Related Interventions Ask the client to develop and  process a list of key past and present life conflicts that continue to cause worry. Assist the client in becoming aware of key unresolved life conflicts and in starting to work toward their resolution. Objective Learn to accept limitations in life and commit to tolerating, rather than avoiding, unpleasant emotions while accomplishing meaningful goals. Target Date: 2021-12-18 Frequency: every three weeks  Progress: 75 Modality: individual  Related Interventions Use techniques from Acceptance and Commitment Therapy to help client accept uncomfortable realities such as lack of complete control, imperfections, and uncertainty and tolerate unpleasant emotions and  thoughts in order to accomplish value-consistent goals. Objective Identify and engage in pleasant activities on a daily basis. Target Date: 2021-12-18 Frequency: every three weeks  Progress: 80 Modality: individual  Related Interventions Engage the client in behavioral activation, increasing the client's contact with sources of reward, identifying processes that inhibit activation, and teaching skills to solve life problems (or assign "Identify and Schedule Pleasant Activities" in the Adult Psychotherapy Homework Planner by Jongsma); use behavioral techniques such as instruction, rehearsal, role-playing, role reversal as needed to assist adoption into the client's daily life; reinforce success. Objective Identify, challenge, and replace biased, fearful self-talk with positive, realistic, and empowering self-talk. Target Date: 2021-12-18 Frequency: every three weeks  Progress: 80 Modality: individual  Related Interventions Assign the client a homework exercise in which he/she identifies fearful self-talk, identifies biases in the self-talk, generates alternatives, and tests through behavioral experiments (or assign "Negative Thoughts Trigger Negative Feelings" in the Adult Psychotherapy Homework Planner by [facility]); review and reinforce success, providing corrective feedback toward improvement. Explore the client's schema and self-talk that mediate his/her fear response; assist him/her in challenging the biases; replace the distorted messages with reality-based alternatives and positive, realistic self-talk that will increase his/her self-confidence in coping with irrational fears (see Cognitive Therapy of Anxiety Disorders by Alison Stalling). Objective Learn and implement a strategy to limit the association between various environmental settings and worry, delaying the worry until a designated "worry time." Target Date: 2021-12-18 Frequency: every three weeks  Progress: 95 Modality: individual   Related Interventions Teach the client how to recognize, stop, and postpone worry to the agreed upon worry time using skills such as thought stopping, relaxation, and redirecting attention (or assign "Making Use of the Thought-Stopping Technique" and/or "Worry Time" in the Adult Psychotherapy Homework Planner by Jongsma to assist skill development); encourage use in daily life; review and reinforce success while providing corrective feedback toward improvement. Explain the rationale for using a worry time as well as how it is to be used; agree upon and implement a worry time with the client. Objective Learn and implement calming skills to reduce overall anxiety and manage anxiety symptoms. Target Date: 2021-12-18 Frequency: every three weeks  Progress: 75 Modality: individual  Related Interventions Teach the client calming/relaxation skills (e.g., applied relaxation, progressive muscle relaxation, cue-controlled relaxation; mindful breathing; biofeedback) and how to discriminate better between relaxation and tension; teach the client how to apply these skills to his/her daily life (e.g., New Directions in Progressive Muscle Relaxation by Casper Harrison, and Hazlett-Stevens; Treating Generalized Anxiety Disorder by Rygh and Amparo Bristol). 11. Let go of key thoughts, beliefs, and past life events in order to maximize time free from obsessions and compulsions. 12. Reach a level of reduced tension, increased satisfaction, and improved communication with family and/or other authority figures. Objective Increase the number of positive family interactions by planning activities. Target Date: 2021-12-18 Frequency: every three weeks  Progress: 75 Modality: individual  Related Interventions  Assist the client in developing a list of positive family activities that promote harmony (e.g., bowling, fishing, playing table games, doing work projects). Schedule such activities into the family  calendar. Objective Family members demonstrate increased openness by sharing thoughts and feelings about family dynamics, roles, and expectations. Target Date: 2021-12-18 Frequency: every three weeks  Progress: 80 Modality: individual  Objective Report an increase in resolving conflicts with parents by talking calmly and assertively rather than aggressively and defensively. Target Date: 2021-12-18 Frequency: every three weeks  Progress: 85 Modality: individual  Related Interventions Use role-playing, role reversal, modeling, and behavioral rehearsal to help the client develop assertive ways to resolve conflict with parents (recommend Your Perfect Right: Assertiveness and Equality in Your Life and Relationships by Neldon Newport). 13. Reduce overall frequency, intensity, and duration of the anxiety so that daily functioning is not impaired. 14. Resolve key life conflicts and the emotional stress that fuels obsessive-compulsive behavior patterns. Objective Accept or work to resolve identified life conflicts. Target Date: 2021-12-18 Frequency: every three weeks  Progress: 90 Modality: individual  Related Interventions Explore the resolution of identified interpersonal or other identified life conflicts; assist the client with acceptance of those that cannot be changed or use a conflict-resolution approach to address those that can. Objective Verbalize an accurate understanding of OCD, how it develops, and how it is maintained. Target Date: 2021-12-18 Frequency: every three weeks  Progress: 95 Modality: individual  Related Interventions Convey a biopsychosocial model for the development and maintenance of OCD highlighting the role of unwarranted fear and avoidance in its maintenance (see Mastery of Obsessive-Compulsive Disorder by Harland Dingwall). 15. Stabilize anxiety level while increasing ability to function on a daily basis  Diagnosis: Generalized anxiety disorder  Major depressive  disorder, recurrent, mild  Plan:  -meet again on Wednesday, February 05, 2022 at [place], [facility]

## 2022-02-03 ENCOUNTER — Encounter: Payer: Self-pay | Admitting: Family Medicine

## 2022-02-05 ENCOUNTER — Ambulatory Visit (INDEPENDENT_AMBULATORY_CARE_PROVIDER_SITE_OTHER): Payer: 59 | Admitting: Professional

## 2022-02-05 ENCOUNTER — Encounter: Payer: Self-pay | Admitting: Professional

## 2022-02-05 DIAGNOSIS — F411 Generalized anxiety disorder: Secondary | ICD-10-CM | POA: Diagnosis not present

## 2022-02-05 DIAGNOSIS — F429 Obsessive-compulsive disorder, unspecified: Secondary | ICD-10-CM

## 2022-02-05 DIAGNOSIS — F320 Major depressive disorder, single episode, mild: Secondary | ICD-10-CM | POA: Diagnosis not present

## 2022-02-05 NOTE — Progress Notes (Signed)
[facility] Counselor/Therapist Progress Note  Patient ID: Katherine Michael, MRN: [id number],    Date: 02/05/2022  Time Spent: 43 minutes [id number]  Treatment Type: Individual Therapy  Risk Assessment: Danger to Self:  No Self-injurious Behavior: No Danger to Others: No Duty to Warn:no  Subjective:  This session was held via video teletherapy. The patient consented to video teletherapy and was located at her home during this session. She is aware it is the responsibility of the patient to secure confidentiality on her end of the session. The provider was in a private home office for the duration of this session.   The patient arrived on time for her webex session.  Issues addressed: 1-pregnancy -halfway through the pregnancy -notices how fatigues she is -she is physically too tired to keep up with daily tasks   -spouse has been supportive -[name] has been asking questions -pt admits fear of an epidural   -not afraid of needles but that size of needle   -concern for blood pressure 2-mood -has been depressed but is staying aware of her symptoms -is not struggling with anxiety 3-community group -prayer group -praying for the pt's family situation   -pt concerned about how to manage upcoming holidays -pt learned of another young woman who is dealing with the same family issues   -she is going to meet with her to talk   -[name] was supportive of her meeting  2023 Problems Addressed  Anxiety, Depression, Low Self-Esteem/Lack of Assertiveness  Goals 1. Address and eliminate maladaptive thought processes that lead to anxious responses. 2. Enhance ability to handle effectively life stressors. 3. Identify and increase intrapersonal, interpersonal, and physical resources to foster positive coping strategies. 4. Improve depressed mood to maximize effective social, occupational, and physical functioning. Objective Complete psychological testing to understand the  severity of depressive symptoms. Target Date: 2023-01-06 Frequency: Monthly  Progress: 0 Modality: individual  Objective Articulate signs and symptoms of depression in current life experiences. Target Date: 2023-01-06 Frequency: Monthly  Progress: 0 Modality: individual  Related Interventions Encourage the client to share her feelings of depression to gain an insight into precipitating events and implications of symptoms; normalize her feelings of depression. Evaluate historical influences on current depressive symptoms, including previous episodes of depression, treatment history, and family history. Objective Identify precipitating events and factors of depression, particularly roles of self and others in depression. Target Date: 2023-01-06 Frequency: Monthly  Progress: 0 Modality: individual  Objective Increase the frequency of engaging in pleasant activities. Target Date: 2023-01-06 Frequency: Monthly  Progress: 0 Modality: individual  Related Interventions Assign the client reading materials regarding overcoming depression for women (e.g., Women and Depression: A Practical Self-Help Guide by Allyne Gee; Feeling Good by Lawerance Bach; Women & Depression by Collier Bullock; Silencing the Self: Women and Depression by Ree Kida). Objective Implement assertiveness to communicate needs and desires to others. Target Date: 2023-01-06 Frequency: Monthly  Progress: 0 Modality: individual  Related Interventions Offer the client booster sessions as necessary. Objective Articulate self-care methods to prevent and/or manage depression in the future; create a list of 10 coping methods for future use. Target Date: 2023-01-06 Frequency: Monthly  Progress: 0 Modality: individual  Objective Articulate counseling goals as well as additional treatment needs focused on reducing depression. Target Date: 2023-01-06 Frequency: Monthly  Progress: 0 Modality: individual  Related Interventions Assist the client in developing  a list of negative messages about women that have reinforced her symptoms of depression; request that the client outline her behaviors and feelings associated with the negative  messages. Objective Implement behavioral interventions to overcome depression. Target Date: 2023-01-06 Frequency: Monthly  Progress: 0 Modality: individual  Related Interventions Assist the client to consider upcoming stressors; brainstorm with her 10 ways to mitigate depression in the future. 5. Increase ability to express needs and desires openly and honestly. Objective Verbalize an understanding of the differences between passive, assertive, and aggressive behavior. Target Date: 2023-01-06 Frequency: Monthly  Progress: 0 Modality: individual  Related Interventions Encourage participation in physical activities that foster confidence and well-being (e.g., yoga, meditation, martial arts). Assist the client in identifying intrapersonal and interpersonal obstacles to self-esteem (e.g., self-deprecating comments, perfectionism, unhealthy relationships). Objective Identify three anxiety-related fears associated with being assertive. Target Date: 2023-01-06 Frequency: Monthly  Progress: 0 Modality: individual  Related Interventions Encourage the client to keep a journal in which she writes down her positive experiences and tracks her progress in building her self-esteem. Explore with the client the messages she received growing up regarding appropriate behavior for girls and women and the degree to which she internalized these messages. Objective Recall situations in which beliefs and feelings were asserted. Target Date: 2023-01-06 Frequency: Monthly  Progress: 0 Modality: individual  Objective Identify and strengthen connections to others who affirm self-esteem and respect one's assertiveness. Target Date: 2023-01-06 Frequency: Monthly  Progress: 0 Modality: individual  Objective Practice saying "no" to at least  one person, declining to comply with a request/favor or identify and assert rights, needs, and wants. Target Date: 2023-01-06 Frequency: Monthly  Progress: 0 Modality: individual  6. Increase assertiveness skills and ability to advocate for self. 7. Increase involvement in activities that foster confidence and a sense of accomplishment. 8. Increase overall sense of well-being via reduction/elimination of anxiety. Objective Verbalize anxiety symptoms as well as when, where, and how frequently they occur; describe attempts to resolve anxiety. Target Date: 2023-01-06 Frequency: Monthly  Progress: 0 Modality: individual  Related Interventions Explore with the client her current understanding of the external and internal factors that contribute to her anxiety (e.g., gender roles, social relationships/lack of support, socioeconomic status, trauma, brain chemistry, and hormones). Objective Implement self-care strategies that serve to augment the positive effects of other interventions. Target Date: 2023-01-06 Frequency: Monthly  Progress: 0 Modality: individual  Related Interventions Elicit the client's specific patterns of thought that contribute to anxious states; aid client in understanding the connection between the identified maladaptive thoughts and anxious feelings. Objective Identify precipitating events, thoughts, feelings, and reactions that are believed to contribute to anxiety. Target Date: 2023-01-06 Frequency: Monthly  Progress: 0 Modality: individual  Related Interventions Educate the client on the relaxing, calming, and balancing benefits of practices such as yoga, meditation, and prayer; provide suggestions on how to get started (e.g., book, video, local gym class). Objective Learn and practice methods of reducing anxiety in a variety of situations. Target Date: 2023-01-06 Frequency: Monthly  Progress: 0 Modality: individual  Related Interventions Educate the client on  thought-stopping techniques and positive reframing in order to preempt anxious reactions. Encourage the use of positive self-talk as a replacement to some of the automatic, negative self-talk being utilized currently. Objective Acknowledge underlying irrational or illogical thought patterns that contribute to anxiety. Target Date: 2023-01-06 Frequency: Monthly  Progress: 0 Modality: individual  Related Interventions Aid the client in seeing the connection between a focus on helping others or improving society and a reduction in anxiety (i.e., dwelling on maladaptive thoughts). Objective Self-correct maladaptive thought patterns preemptively in order to lessen or eliminate anxiety at least 90% of the time;  increase positive self-talk. Target Date: 2023-01-06 Frequency: Monthly  Progress: 0 Modality: individual  Related Interventions Formulate with the client at least three possible solutions to resisting expectations that are limiting and anxiety producing. Collaborate with the client to determine what kind of changes need to be made, internally and externally, for calmness and satisfaction to exist within the arenas of work, home, and school. Devise a plan, together, to allow the client to end toxic relationships and save those that she believes are worth salvaging; role-play assertive and effective interactions with others. Objective Verbalize the degree to which current life circumstances could be improved in areas such as work, home, and school. Target Date: 2023-01-06 Frequency: Monthly  Progress: 0 Modality: individual  Objective Retake the objective measure of anxiety that was administered at the beginning of therapy; discuss the extent to which anxiety symptoms have been reduced in severity and duration since beginning therapy. Target Date: 2023-01-06 Frequency: Monthly  Progress: 0 Modality: individual  Objective Identify the relationship between multiple roles, role overload, role  strain, and anxiety reactions. Target Date: 2023-01-06 Frequency: Monthly  Progress: 0 Modality: individual  Related Interventions Compare current test results to baseline scores in order to reevaluate occurrence and severity of anxiety symptoms and treatment effectiveness; review results and elicit further feedback from client regarding treatment efficacy.  2022 Treatment Plan Problems Addressed  Anxiety, Family Conflict, Low Self-Esteem, Obsessive-Compulsive Disorder (OCD)  Goals 1. Accept the presence of obsessive thoughts without acting on them and commit to a value-driven life. Objective Gain insight into how childhood experiences might influence current struggles with OCD and take appropriate actions. Target Date: 2021-12-18 Frequency: every three weeks  Progress: 85 Modality: individual  Objective Engage in a strategic ordeal to overcome OCD impulses. Target Date: 2021-12-18 Frequency: every three weeks  Progress: 90 Modality: individual  Objective Identify and discuss unresolved life conflicts. Target Date: 2021-12-18 Frequency: every three weeks  Progress: 80 Modality: individual  Related Interventions Explore the client's life circumstances to help identify key unresolved conflicts that may underlie OCD. Objective Keep a weekly journal of obsessions, compulsions, and triggers; record thoughts, feelings, and actions taken. Target Date: 2021-12-18 Frequency: every three weeks  Progress: 50 Modality: individual  Related Interventions Ask the client to self-monitor obsessions, compulsions, and triggers; record thoughts, feelings, and actions taken; routinely process the data to facilitate the accomplishment of therapeutic objectives (or assign "Analyze the Probability of a Feared Event" in the Adult Psychotherapy Homework Planner by [name]). 2. Achieve a reasonable level of family connectedness and harmony where members support, help, and are concerned for each other. 3.  Decrease the level of present conflict with parents while beginning to let go of or resolving past conflicts with them. 4. Demonstrate improved self-esteem through more pride in appearance, more assertiveness, greater eye contact, and identification of positive traits in self-talk messages. 5. Develop a consistent, positive self-image. 6. Elevate self-esteem. 7. Enhance ability to effectively cope with the full variety of life's worries and anxieties. 8. Establish an inward sense of self-worth, confidence, and competence. 9. Function daily at a consistent level with minimal interference from obsessions and compulsions. 10. Learn and implement coping skills that result in a reduction of anxiety and worry, and improved daily functioning. Objective Learn and implement problem-solving strategies for realistically addressing worries. Target Date: 2021-12-18 Frequency: every three weeks  Progress: 90 Modality: individual  Related Interventions Teach the client problem-solving strategies involving specifically defining a problem, generating options for addressing it, evaluating the pros and cons  of each option, selecting and implementing an optional action, and reevaluating and refining the action (or assign "Applying Problem-Solving to Interpersonal Conflict" in the Adult Psychotherapy Homework Planner by Bryn Gulling). Objective Identify the major life conflicts from the past and present that form the basis for present anxiety. Target Date: 2021-12-18 Frequency: every three weeks  Progress: 90 Modality: individual  Related Interventions Ask the client to develop and process a list of key past and present life conflicts that continue to cause worry. Assist the client in becoming aware of key unresolved life conflicts and in starting to work toward their resolution. Objective Learn to accept limitations in life and commit to tolerating, rather than avoiding, unpleasant emotions while accomplishing  meaningful goals. Target Date: 2021-12-18 Frequency: every three weeks  Progress: 75 Modality: individual  Related Interventions Use techniques from Acceptance and Commitment Therapy to help client accept uncomfortable realities such as lack of complete control, imperfections, and uncertainty and tolerate unpleasant emotions and thoughts in order to accomplish value-consistent goals. Objective Identify and engage in pleasant activities on a daily basis. Target Date: 2021-12-18 Frequency: every three weeks  Progress: 80 Modality: individual  Related Interventions Engage the client in behavioral activation, increasing the client's contact with sources of reward, identifying processes that inhibit activation, and teaching skills to solve life problems (or assign "Identify and Schedule Pleasant Activities" in the Adult Psychotherapy Homework Planner by Jongsma); use behavioral techniques such as instruction, rehearsal, role-playing, role reversal as needed to assist adoption into the client's daily life; reinforce success. Objective Identify, challenge, and replace biased, fearful self-talk with positive, realistic, and empowering self-talk. Target Date: 2021-12-18 Frequency: every three weeks  Progress: 80 Modality: individual  Related Interventions Assign the client a homework exercise in which he/she identifies fearful self-talk, identifies biases in the self-talk, generates alternatives, and tests through behavioral experiments (or assign "Negative Thoughts Trigger Negative Feelings" in the Adult Psychotherapy Homework Planner by [facility]); review and reinforce success, providing corrective feedback toward improvement. Explore the client's schema and self-talk that mediate his/her fear response; assist him/her in challenging the biases; replace the distorted messages with reality-based alternatives and positive, realistic self-talk that will increase his/her self-confidence in coping with irrational  fears (see Cognitive Therapy of Anxiety Disorders by Alison Stalling). Objective Learn and implement a strategy to limit the association between various environmental settings and worry, delaying the worry until a designated "worry time." Target Date: 2021-12-18 Frequency: every three weeks  Progress: 95 Modality: individual  Related Interventions Teach the client how to recognize, stop, and postpone worry to the agreed upon worry time using skills such as thought stopping, relaxation, and redirecting attention (or assign "Making Use of the Thought-Stopping Technique" and/or "Worry Time" in the Adult Psychotherapy Homework Planner by Jongsma to assist skill development); encourage use in daily life; review and reinforce success while providing corrective feedback toward improvement. Explain the rationale for using a worry time as well as how it is to be used; agree upon and implement a worry time with the client. Objective Learn and implement calming skills to reduce overall anxiety and manage anxiety symptoms. Target Date: 2021-12-18 Frequency: every three weeks  Progress: 75 Modality: individual  Related Interventions Teach the client calming/relaxation skills (e.g., applied relaxation, progressive muscle relaxation, cue-controlled relaxation; mindful breathing; biofeedback) and how to discriminate better between relaxation and tension; teach the client how to apply these skills to his/her daily life (e.g., New Directions in Progressive Muscle Relaxation by Casper Harrison, and Hazlett-Stevens; Treating Generalized Anxiety Disorder  by Rygh and Ida Rogue). 11. Let go of key thoughts, beliefs, and past life events in order to maximize time free from obsessions and compulsions. 12. Reach a level of reduced tension, increased satisfaction, and improved communication with family and/or other authority figures. Objective Increase the number of positive family interactions by planning  activities. Target Date: 2021-12-18 Frequency: every three weeks  Progress: 75 Modality: individual  Related Interventions Assist the client in developing a list of positive family activities that promote harmony (e.g., bowling, fishing, playing table games, doing work projects). Schedule such activities into the family calendar. Objective Family members demonstrate increased openness by sharing thoughts and feelings about family dynamics, roles, and expectations. Target Date: 2021-12-18 Frequency: every three weeks  Progress: 80 Modality: individual  Objective Report an increase in resolving conflicts with parents by talking calmly and assertively rather than aggressively and defensively. Target Date: 2021-12-18 Frequency: every three weeks  Progress: 85 Modality: individual  Related Interventions Use role-playing, role reversal, modeling, and behavioral rehearsal to help the client develop assertive ways to resolve conflict with parents (recommend Your Perfect Right: Assertiveness and Equality in Your Life and Relationships by Carnella Guadalajara). 13. Reduce overall frequency, intensity, and duration of the anxiety so that daily functioning is not impaired. 14. Resolve key life conflicts and the emotional stress that fuels obsessive-compulsive behavior patterns. Objective Accept or work to resolve identified life conflicts. Target Date: 2021-12-18 Frequency: every three weeks  Progress: 90 Modality: individual  Related Interventions Explore the resolution of identified interpersonal or other identified life conflicts; assist the client with acceptance of those that cannot be changed or use a conflict-resolution approach to address those that can. Objective Verbalize an accurate understanding of OCD, how it develops, and how it is maintained. Target Date: 2021-12-18 Frequency: every three weeks  Progress: 95 Modality: individual  Related Interventions Convey a biopsychosocial model for  the development and maintenance of OCD highlighting the role of unwarranted fear and avoidance in its maintenance (see Mastery of Obsessive-Compulsive Disorder by Phebe Colla). 15. Stabilize anxiety level while increasing ability to function on a daily basis  Diagnosis: Generalized anxiety disorder  Major depressive disorder, recurrent, mild  Plan:  -meet again on Wednesday, March 05, 2022 at 8am.  Teofilo Pod, [facility]

## 2022-03-05 ENCOUNTER — Ambulatory Visit: Payer: 59 | Admitting: Professional

## 2022-06-02 ENCOUNTER — Other Ambulatory Visit: Payer: Self-pay | Admitting: Neurology

## 2022-06-02 DIAGNOSIS — F429 Obsessive-compulsive disorder, unspecified: Secondary | ICD-10-CM

## 2022-06-02 DIAGNOSIS — F320 Major depressive disorder, single episode, mild: Secondary | ICD-10-CM

## 2022-06-02 MED ORDER — SERTRALINE HCL 100 MG PO TABS: ORAL_TABLET | ORAL | 0 refills | Status: DC

## 2022-09-01 ENCOUNTER — Other Ambulatory Visit: Payer: Self-pay | Admitting: Family Medicine

## 2022-09-01 DIAGNOSIS — F429 Obsessive-compulsive disorder, unspecified: Secondary | ICD-10-CM

## 2022-09-01 DIAGNOSIS — F320 Major depressive disorder, single episode, mild: Secondary | ICD-10-CM

## 2022-09-01 NOTE — Telephone Encounter (Signed)
Patient called requesting a refill of ; Zoloft 100mg    Pharmacy ;  Please send refills to  [facility] in [place] [phone] Please udate in system

## 2022-09-10 ENCOUNTER — Encounter: Payer: Self-pay | Admitting: Family Medicine

## 2022-09-10 ENCOUNTER — Ambulatory Visit: Payer: Managed Care, Other (non HMO) | Admitting: Family Medicine

## 2022-09-10 VITALS — BP 108/52 | HR 73 | Ht 64.0 in | Wt 125.0 lb

## 2022-09-10 DIAGNOSIS — F429 Obsessive-compulsive disorder, unspecified: Secondary | ICD-10-CM

## 2022-09-10 DIAGNOSIS — F411 Generalized anxiety disorder: Secondary | ICD-10-CM | POA: Diagnosis not present

## 2022-09-10 DIAGNOSIS — F320 Major depressive disorder, single episode, mild: Secondary | ICD-10-CM | POA: Diagnosis not present

## 2022-09-10 MED ORDER — SERTRALINE HCL 100 MG PO TABS: ORAL_TABLET | ORAL | 1 refills | Status: DC

## 2022-09-10 NOTE — Assessment & Plan Note (Signed)
Under good control right now.

## 2022-09-10 NOTE — Progress Notes (Signed)
Established Patient Office Visit  Subjective   Patient ID: Katherine Michael, female    DOB: 1995/10/06  Age: 27 y.o. MRN: [id number]  Chief Complaint  Patient presents with   mood    HPI  F/U Anxiety- Depression - OCD - she is doing well. Had a c/sec on Feb and her little one Jonny Ruiz is doing well.  Not having days of symptoms.  Sleeping well. Husband helping with the baby.  Not currently engage in therapy.  She feels she is doing well.    She has an IUD place and will taper off her OCPs at end of the month.       09/10/2022   11:06 AM 10/24/2021   10:34 AM 06/25/2021    8:22 AM  Depression screen PHQ 2/9  Decreased Interest 0 0   Down, Depressed, Hopeless 0 1   PHQ - 2 Score 0 1   Altered sleeping 1 0   Tired, decreased energy 1 1   Change in appetite 0 0   Feeling bad or failure about yourself  0 0   Trouble concentrating 0 0   Moving slowly or fidgety/restless 0 0   Suicidal thoughts 0 0   PHQ-9 Score 2 2   Difficult doing work/chores Not difficult at all Somewhat difficult      Information is confidential and restricted. Go to Review Flowsheets to unlock data.      09/10/2022   11:07 AM 10/24/2021   10:34 AM 06/25/2021    8:20 AM 12/13/2020    2:11 PM  GAD 7 : Generalized Anxiety Score  Nervous, Anxious, on Edge 1 1  2   Control/stop worrying 0 1  1  Worry too much - different things 1 1  2   Trouble relaxing 0 0  1  Restless 0 0  1  Easily annoyed or irritable 0 1  0  Afraid - awful might happen 0 0  0  Total GAD 7 Score 2 4  7   Anxiety Difficulty Not difficult at all Somewhat difficult  Somewhat difficult     Information is confidential and restricted. Go to Review Flowsheets to unlock data.         ROS    Objective:     BP (!) 108/52   Pulse 73   Ht 5\' 4"  (1.626 m)   Wt 125 lb (56.7 kg)   SpO2 97%   BMI 21.46 kg/m    Physical Exam Vitals and nursing note reviewed.  Constitutional:      Appearance: She is well-developed.  HENT:     Head:  Normocephalic and atraumatic.  Cardiovascular:     Rate and Rhythm: Normal rate and regular rhythm.     Heart sounds: Normal heart sounds.  Pulmonary:     Effort: Pulmonary effort is normal.     Breath sounds: Normal breath sounds.  Skin:    General: Skin is warm and dry.  Neurological:     Mental Status: She is alert and oriented to person, place, and time.  Psychiatric:        Behavior: Behavior normal.      No results found for any visits on 09/10/22.    The ASCVD Risk score (Arnett DK, et al., 2019) failed to calculate for the following reasons:   The 2019 ASCVD risk score is only valid for ages 6 to 42    Assessment & Plan:   Problem List Items Addressed This Visit  Other   Obsessive-compulsive disorder    Under good control right now.       Relevant Medications   sertraline (ZOLOFT) 100 MG tablet   Generalized anxiety disorder - Primary    Continue current regimen. Reach out if you decide that you would like to reengage in therapy/counseling.  Medication refill sent.  Otherwise follow-up in 6 to 8 months.      Relevant Medications   sertraline (ZOLOFT) 100 MG tablet   Depression, major, single episode, mild (HCC)   Relevant Medications   sertraline (ZOLOFT) 100 MG tablet    Return in about 6 months (around 03/20/2023) for Mood medication .    Nani Gasser, MD

## 2022-09-10 NOTE — Assessment & Plan Note (Signed)
Continue current regimen. Reach out if you decide that you would like to reengage in therapy/counseling.  Medication refill sent.  Otherwise follow-up in 6 to 8 months.

## 2022-12-01 ENCOUNTER — Other Ambulatory Visit: Payer: Self-pay | Admitting: Family Medicine

## 2022-12-01 DIAGNOSIS — F320 Major depressive disorder, single episode, mild: Secondary | ICD-10-CM

## 2022-12-01 DIAGNOSIS — F429 Obsessive-compulsive disorder, unspecified: Secondary | ICD-10-CM

## 2023-02-14 IMAGING — DX DG KNEE 1-2V*L*
2 series · 2 of 2 positions shown · non-contrast
Comparison: None.

CLINICAL DATA: Right knee pain x2 years with left knee imaged for
comparison.

EXAM:
LEFT KNEE - 1-2 VIEW

[knee ap bilat standing]
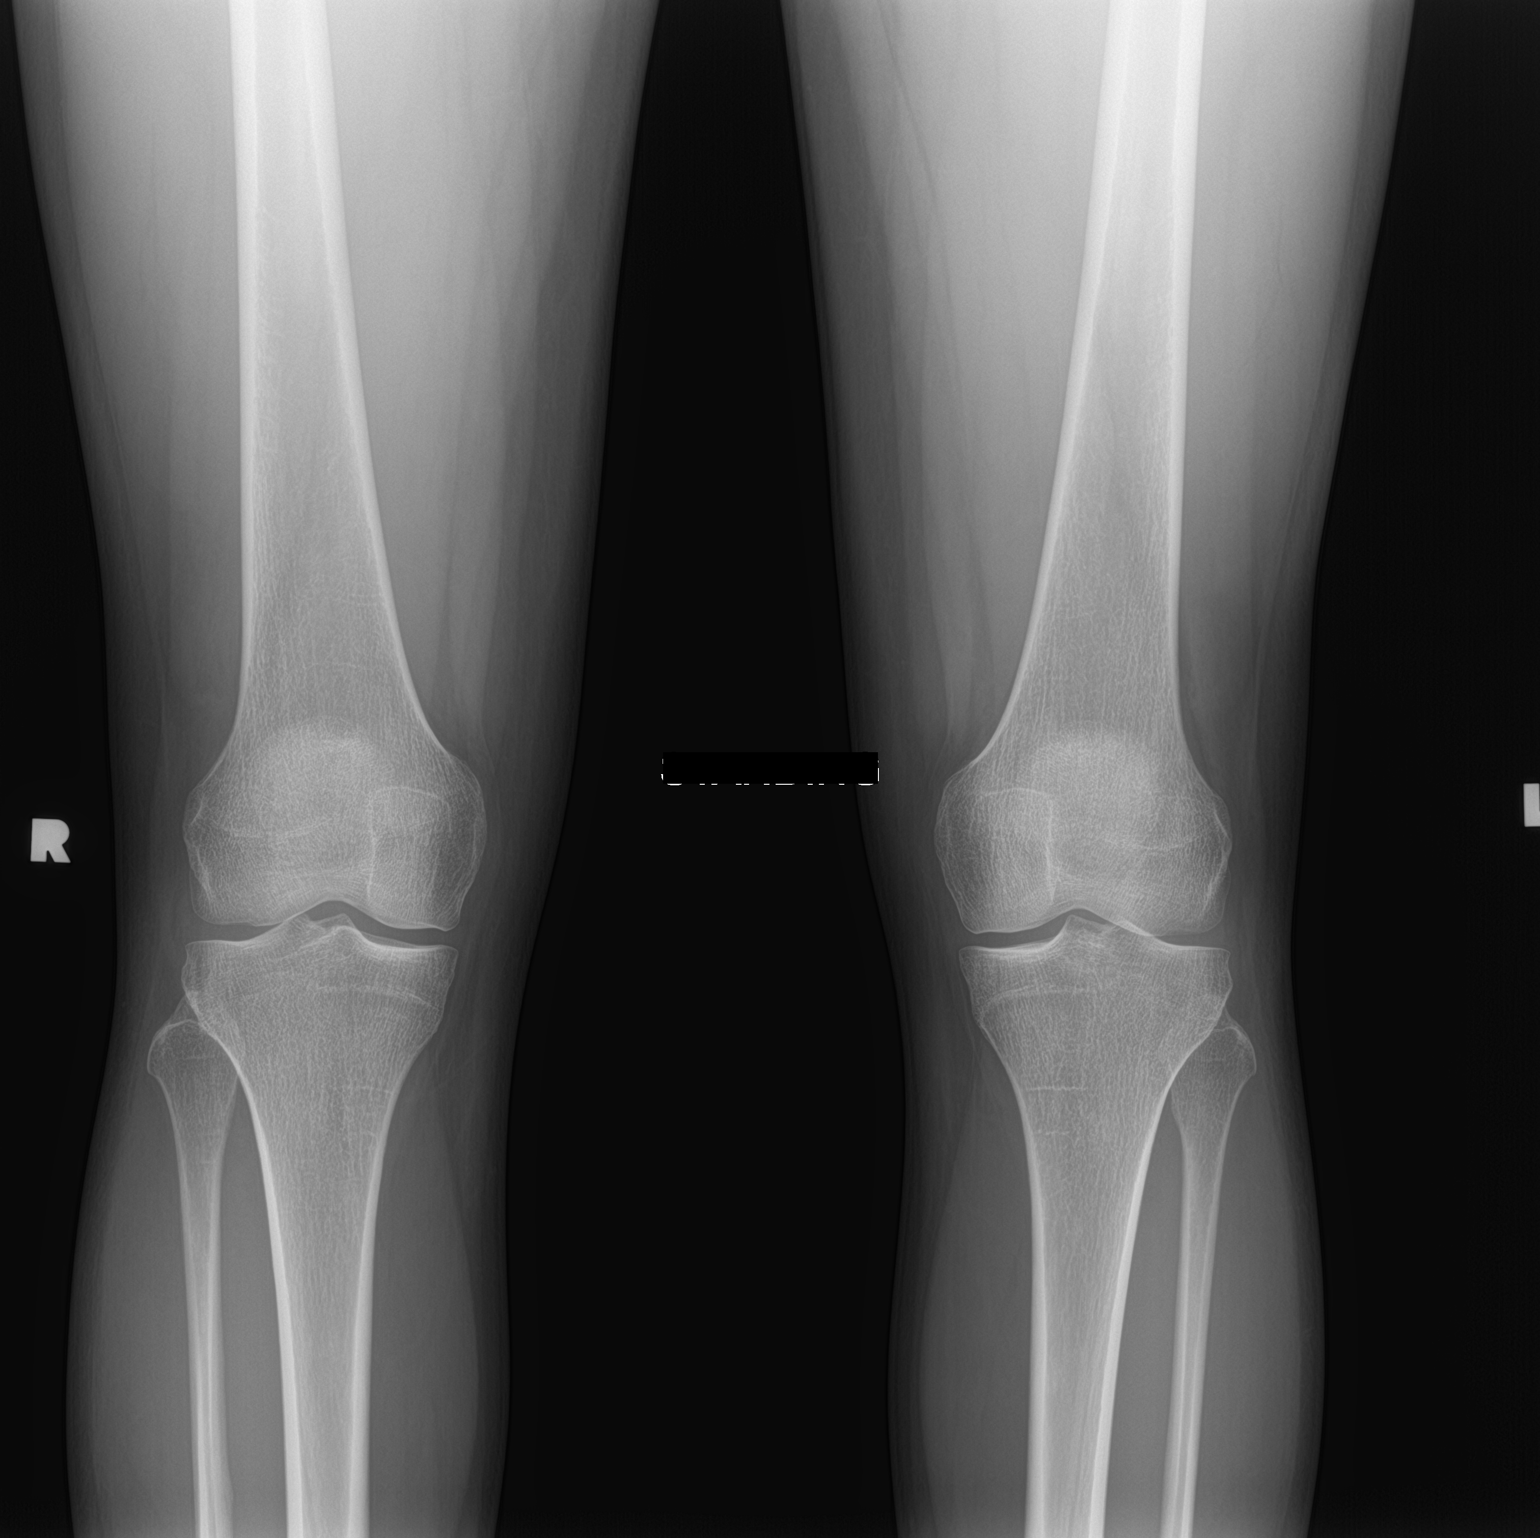

[knee tunnel]
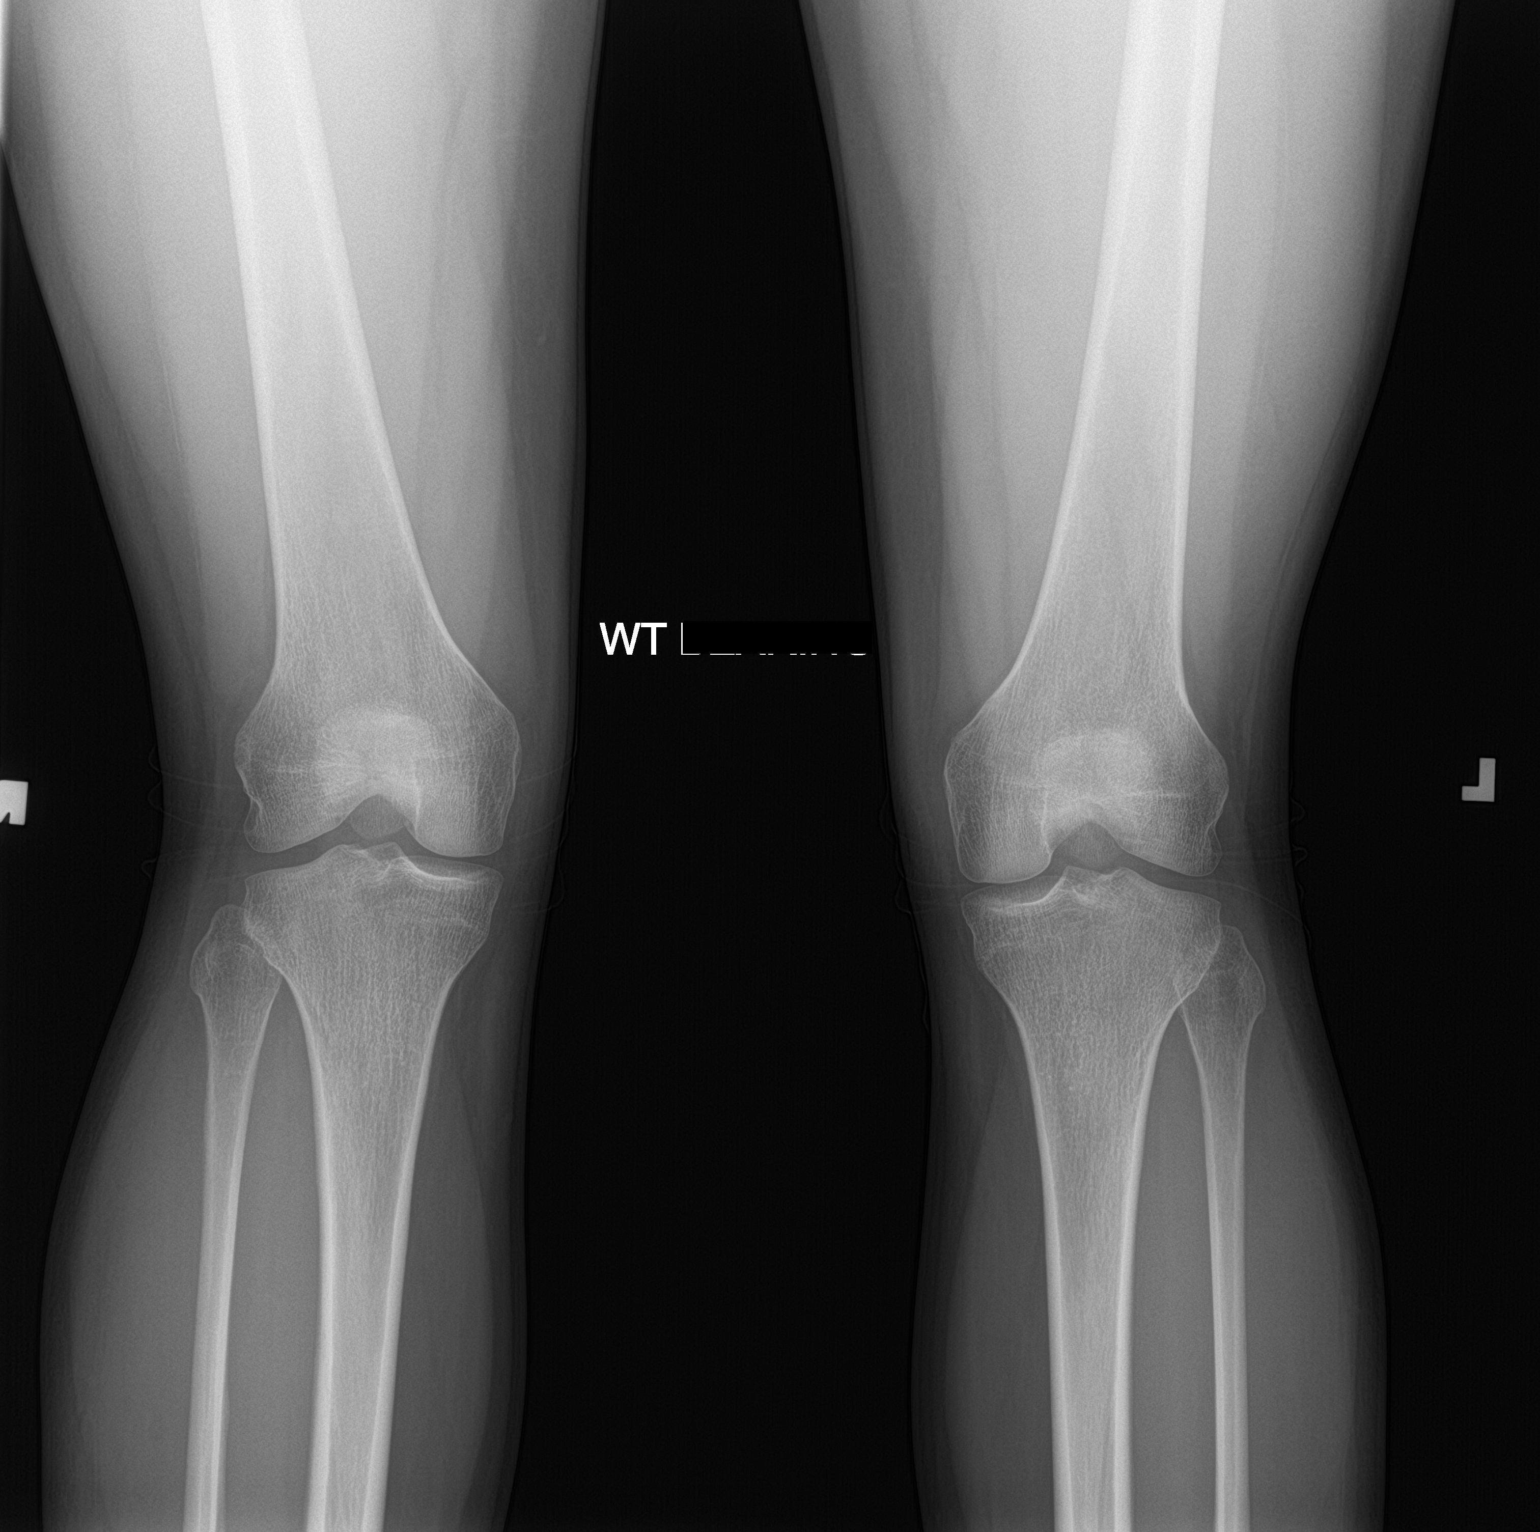

[2 of 2 positions shown; findings below may reference images not displayed]

FINDINGS: No evidence of fracture, dislocation, or joint effusion. No evidence
of arthropathy or other focal bone abnormality. Soft tissues are
unremarkable.
IMPRESSION: Negative.

## 2023-03-25 ENCOUNTER — Encounter: Payer: Self-pay | Admitting: Family Medicine

## 2023-03-25 NOTE — Telephone Encounter (Signed)
Lets go ahead and have her schedule a virtual visit so we can talk about the transition.  If she wants to go ahead and start cutting the sertraline in half and taking half a tab daily until we meet that will help this towards the transition.

## 2023-03-25 NOTE — Telephone Encounter (Signed)
Attempted call to patient. Left a voice mail message requesting a return call.  

## 2023-04-14 ENCOUNTER — Encounter: Payer: Self-pay | Admitting: Family Medicine

## 2023-04-14 ENCOUNTER — Telehealth (INDEPENDENT_AMBULATORY_CARE_PROVIDER_SITE_OTHER): Payer: Managed Care, Other (non HMO) | Admitting: Family Medicine

## 2023-04-14 VITALS — Ht 64.0 in | Wt 124.0 lb

## 2023-04-14 DIAGNOSIS — F411 Generalized anxiety disorder: Secondary | ICD-10-CM

## 2023-04-14 DIAGNOSIS — F429 Obsessive-compulsive disorder, unspecified: Secondary | ICD-10-CM

## 2023-04-14 DIAGNOSIS — F320 Major depressive disorder, single episode, mild: Secondary | ICD-10-CM

## 2023-04-14 DIAGNOSIS — T887XXA Unspecified adverse effect of drug or medicament, initial encounter: Secondary | ICD-10-CM

## 2023-04-14 MED ORDER — AZITHROMYCIN 250 MG PO TABS: ORAL_TABLET | ORAL | 0 refills | Status: DC

## 2023-04-14 MED ORDER — SERTRALINE HCL 25 MG PO TABS: ORAL_TABLET | ORAL | 0 refills | Status: DC

## 2023-04-14 MED ORDER — ESCITALOPRAM OXALATE 10 MG PO TABS: ORAL_TABLET | ORAL | 1 refills | Status: DC

## 2023-04-14 NOTE — Progress Notes (Signed)
 Virtual Visit via Video Note  I connected with Katherine Michael on 04/14/23 at  8:10 AM EST by a video enabled telemedicine application and verified that I am speaking with the correct person using two identifiers.   I discussed the limitations of evaluation and management by telemedicine and the availability of in person appointments. The patient expressed understanding and agreed to proceed.  Patient location: at home Provider location: in office  Subjective:    CC:  No chief complaint on file.   HPI: Currently on sertraline  100 mg.  She feels like it is just not helping as much as she would like and also is somewhat sedating and has been since she started it.  She also Porte sexual side effects.  She just feels exhausted all the time with taking care of her little 1.  And just feeling like she is not always doing the best job.  She is talked with some friends and is interested in maybe trying Lexapro  instead of the sertraline .   Past medical history, Surgical history, Family history not pertinant except as noted below, Social history, Allergies, and medications have been entered into the medical record, reviewed, and corrections made.    Objective:    General: Speaking clearly in complete sentences without any shortness of breath.  Alert and oriented x3.  Normal judgment. No apparent acute distress.    Impression and Recommendations:    Problem List Items Addressed This Visit       Other   Obsessive-compulsive disorder   Relevant Medications   sertraline  (ZOLOFT ) 25 MG tablet   escitalopram  (LEXAPRO ) 10 MG tablet   Generalized anxiety disorder - Primary   We discussed options.  Will taper off sertraline  and switch to Lexapro .  We discussed that I like to see her back either in person or virtually after she has been on the Lexapro  for about 3 to 4 weeks and we can see how well she is doing at that time.  Also encouraged her to consider getting back in with therapy with  Nathanel I do think it can be really helpful and also setting just some small personal goals to help her do some self-care over the next month would be helpful.      Relevant Medications   sertraline  (ZOLOFT ) 25 MG tablet   escitalopram  (LEXAPRO ) 10 MG tablet   Depression, major, single episode, mild (HCC)   Relevant Medications   sertraline  (ZOLOFT ) 25 MG tablet   escitalopram  (LEXAPRO ) 10 MG tablet   Other Visit Diagnoses       Medication side effect           No orders of the defined types were placed in this encounter.   Meds ordered this encounter  Medications   DISCONTD: azithromycin  (ZITHROMAX ) 250 MG tablet    Sig: 2 Ttabs PO on Day 1, then one a day x 4 days.    Dispense:  6 tablet    Refill:  0   sertraline  (ZOLOFT ) 25 MG tablet    Sig: Take 3 tablets (75 mg total) by mouth daily for 4 days, THEN 2 tablets (50 mg total) daily for 6 days, THEN 1 tablet (25 mg total) daily for 6 days. TAKE 1 TABLET(100 MG) BY MOUTH DAILY.    Dispense:  30 tablet    Refill:  0   escitalopram  (LEXAPRO ) 10 MG tablet    Sig: Take 0.5 tablets (5 mg total) by mouth daily for 8 days, THEN 1  tablet (10 mg total) daily for 22 days.    Dispense:  30 tablet    Refill:  1     I discussed the assessment and treatment plan with the patient. The patient was provided an opportunity to ask questions and all were answered. The patient agreed with the plan and demonstrated an understanding of the instructions.   The patient was advised to call back or seek an in-person evaluation if the symptoms worsen or if the condition fails to improve as anticipated.   Dorothyann Byars, MD

## 2023-04-14 NOTE — Progress Notes (Signed)
Pt would like to possibly switch to lexapro. She reports that the sertraline is not working as well

## 2023-04-14 NOTE — Assessment & Plan Note (Signed)
 We discussed options.  Will taper off sertraline  and switch to Lexapro .  We discussed that I like to see her back either in person or virtually after she has been on the Lexapro  for about 3 to 4 weeks and we can see how well she is doing at that time.  Also encouraged her to consider getting back in with therapy with Nathanel I do think it can be really helpful and also setting just some small personal goals to help her do some self-care over the next month would be helpful.

## 2023-05-02 ENCOUNTER — Other Ambulatory Visit: Payer: Self-pay | Admitting: Family Medicine

## 2023-05-02 DIAGNOSIS — F429 Obsessive-compulsive disorder, unspecified: Secondary | ICD-10-CM

## 2023-05-02 DIAGNOSIS — F320 Major depressive disorder, single episode, mild: Secondary | ICD-10-CM

## 2023-05-02 DIAGNOSIS — F411 Generalized anxiety disorder: Secondary | ICD-10-CM

## 2023-06-08 ENCOUNTER — Encounter: Payer: Self-pay | Admitting: Family Medicine

## 2023-06-08 DIAGNOSIS — F411 Generalized anxiety disorder: Secondary | ICD-10-CM

## 2023-06-08 DIAGNOSIS — F320 Major depressive disorder, single episode, mild: Secondary | ICD-10-CM

## 2023-06-08 DIAGNOSIS — F429 Obsessive-compulsive disorder, unspecified: Secondary | ICD-10-CM

## 2023-06-08 MED ORDER — ESCITALOPRAM OXALATE 20 MG PO TABS: 20.0000 mg | ORAL_TABLET | Freq: Every day | ORAL | 1 refills | Status: DC

## 2023-06-26 MED ORDER — SERTRALINE HCL 50 MG PO TABS: ORAL_TABLET | ORAL | 2 refills | Status: DC

## 2023-06-26 NOTE — Addendum Note (Signed)
 Addended byJomarie Longs on: 06/26/2023 12:50 PM   Modules accepted: Orders

## 2023-06-27 ENCOUNTER — Other Ambulatory Visit: Payer: Self-pay | Admitting: Family Medicine

## 2023-06-27 DIAGNOSIS — F411 Generalized anxiety disorder: Secondary | ICD-10-CM

## 2023-06-27 DIAGNOSIS — F429 Obsessive-compulsive disorder, unspecified: Secondary | ICD-10-CM

## 2023-06-27 DIAGNOSIS — F320 Major depressive disorder, single episode, mild: Secondary | ICD-10-CM

## 2023-10-20 ENCOUNTER — Ambulatory Visit: Payer: Self-pay

## 2023-10-20 NOTE — Telephone Encounter (Signed)
  FYI Only or Action Required?: FYI only for provider.  Patient was last seen in primary care on 04/14/2023 by Alvan Dorothyann BIRCH, MD.  Called Nurse Triage reporting Diarrhea.  Symptoms began several days ago.  Interventions attempted: Rest, hydration, or home remedies.  Symptoms are: unchanged.  Triage Disposition: See Physician Within 24 Hours patient to go to [facility]  Patient/caregiver understands and will follow disposition?: Yes Copied from CRM [id number]. Topic: Clinical - Red Word Triage >> Oct 20, 2023 11:42 AM Suzette B wrote: Patient states that she's having chills, never checked temperature, body aches, nausea and diarrhea, she states she believes she symptoms of norovirus Reason for Disposition  [1] SEVERE diarrhea (e.g., 7 or more times / day more than normal) AND [2] present > 24 hours (1 day)  Answer Assessment - Initial Assessment Questions 1. DIARRHEA SEVERITY: How bad is the diarrhea? How many more stools have you had in the past 24 hours than normal?    - NO DIARRHEA (SCALE 0)   - MILD (SCALE 1-3): Few loose or mushy BMs; increase of 1-3 stools over normal daily number of stools; mild increase in ostomy output.   -  MODERATE (SCALE 4-7): Increase of 4-6 stools daily over normal; moderate increase in ostomy output.   -  SEVERE (SCALE 8-10; OR WORST POSSIBLE): Increase of 7 or more stools daily over normal; moderate increase in ostomy output; incontinence.     8 2. ONSET: When did the diarrhea begin?      Four days ago 3. BM CONSISTENCY: How loose or watery is the diarrhea?      watery 4. VOMITING: Are you also vomiting? If Yes, ask: How many times in the past 24 hours?      denies 5. ABDOMEN PAIN: Are you having any abdomen pain? If Yes, ask: What does it feel like? (e.g., crampy, dull, intermittent, constant)      cramping 6. ABDOMEN PAIN SEVERITY: If present, ask: How bad is the pain?  (e.g., Scale 1-10; mild, moderate, or severe)   - MILD (1-3):  doesn't interfere with normal activities, abdomen soft and not tender to touch    - MODERATE (4-7): interferes with normal activities or awakens from sleep, abdomen tender to touch    - SEVERE (8-10): excruciating pain, doubled over, unable to do any normal activities       2-3 7. ORAL INTAKE: If vomiting, Have you been able to drink liquids? How much liquids have you had in the past 24 hours?     Denies vomiting, drinking water 8. HYDRATION: Any signs of dehydration? (e.g., dry mouth [not just dry lips], too weak to stand, dizziness, new weight loss) When did you last urinate?     denies 9. EXPOSURE: Have you traveled to a foreign country recently? Have you been exposed to anyone with diarrhea? Could you have eaten any food that was spoiled?    Patient was in [place] in early May, 2025 10. ANTIBIOTIC USE: Are you taking antibiotics now or have you taken antibiotics in the past 2 months?       denies  Protocols used: [facility]

## 2023-12-02 ENCOUNTER — Encounter: Payer: Self-pay | Admitting: Family Medicine

## 2023-12-02 MED ORDER — SERTRALINE HCL 50 MG PO TABS: ORAL_TABLET | ORAL | 2 refills | Status: DC

## 2023-12-07 ENCOUNTER — Other Ambulatory Visit: Payer: Self-pay | Admitting: Physician Assistant

## 2023-12-15 ENCOUNTER — Encounter: Payer: Self-pay | Admitting: Sports Medicine

## 2023-12-31 ENCOUNTER — Other Ambulatory Visit: Payer: Self-pay

## 2023-12-31 MED ORDER — SERTRALINE HCL 100 MG PO TABS: 100.0000 mg | ORAL_TABLET | Freq: Every day | ORAL | 3 refills | Status: AC

## 2023-12-31 NOTE — Telephone Encounter (Signed)
 Copied from CRM [id number]. Topic: Clinical - Medication Question >> Dec 31, 2023  8:16 AM [name] wrote: Reason for CRM: Patient wrote this message via Mychart for Dr. Alvan: Hi there! Can I get a refill of my Zoloft  100 mg? That's what I have been taking since I switched back from the lexapro . But they only keep refilling the taper doses so it has been coming in 50 mg tablets instead of 100 so I'm running out quicker.  Thank you, Katherine Michael Could you assist her with this issue? Patients callback number is [phone].

## 2023-12-31 NOTE — Telephone Encounter (Signed)
 Pended prescription for zoloft  100mg   Last written as 50mg  on 12/02/2023 Last OV 04/14/2023 telemedicine Upcoming appt = none
